# Patient Record
Sex: Female | Born: 1985 | Race: White | Hispanic: No | Marital: Married | Smoking: Former smoker
Health system: Southern US, Community
[De-identification: ages and names within clinical notes are randomized; demographics above are authoritative.]

## PROBLEM LIST (undated history)

## (undated) ENCOUNTER — Inpatient Hospital Stay (HOSPITAL_COMMUNITY): Payer: Self-pay

## (undated) DIAGNOSIS — K649 Unspecified hemorrhoids: Secondary | ICD-10-CM

## (undated) DIAGNOSIS — N739 Female pelvic inflammatory disease, unspecified: Secondary | ICD-10-CM

## (undated) DIAGNOSIS — G43909 Migraine, unspecified, not intractable, without status migrainosus: Secondary | ICD-10-CM

## (undated) DIAGNOSIS — F329 Major depressive disorder, single episode, unspecified: Secondary | ICD-10-CM

## (undated) DIAGNOSIS — O139 Gestational [pregnancy-induced] hypertension without significant proteinuria, unspecified trimester: Secondary | ICD-10-CM

## (undated) DIAGNOSIS — F32A Depression, unspecified: Secondary | ICD-10-CM

## (undated) HISTORY — DX: Migraine, unspecified, not intractable, without status migrainosus: G43.909

## (undated) HISTORY — PX: TONSILECTOMY, ADENOIDECTOMY, BILATERAL MYRINGOTOMY AND TUBES: SHX2538

## (undated) HISTORY — DX: Female pelvic inflammatory disease, unspecified: N73.9

## (undated) HISTORY — PX: TONSILLECTOMY: SUR1361

---

## 2012-01-05 ENCOUNTER — Encounter (HOSPITAL_COMMUNITY): Payer: Self-pay | Admitting: *Deleted

## 2012-01-05 ENCOUNTER — Inpatient Hospital Stay (HOSPITAL_COMMUNITY)
Admission: AD | Admit: 2012-01-05 | Discharge: 2012-01-06 | Disposition: A | Discharge status: Home / Self Care | Payer: 59 | Source: Ambulatory Visit | Attending: Obstetrics & Gynecology | Admitting: Obstetrics & Gynecology

## 2012-01-05 DIAGNOSIS — N898 Other specified noninflammatory disorders of vagina: Secondary | ICD-10-CM

## 2012-01-05 DIAGNOSIS — Z30431 Encounter for routine checking of intrauterine contraceptive device: Secondary | ICD-10-CM

## 2012-01-05 DIAGNOSIS — Z01419 Encounter for gynecological examination (general) (routine) without abnormal findings: Secondary | ICD-10-CM

## 2012-01-05 NOTE — Progress Notes (Signed)
PT HAS NOT HAD CYCLE SINCE 12-2007- SINCE MIRENA INSERTED.     WENT TO URGENT CARE YESTERDAY- FOR YEAST INFECTION-  DR Lucianne Muss  COULD NOT FIND STRING.  .  PT HAS NOT FELT STRING.      TOLD TO SEE  A DR.  HER DR IS IN [place].

## 2012-01-06 NOTE — ED Provider Notes (Signed)
History     CSN: [id number]  Arrival date & time 01/05/12  2042   None     No chief complaint on file.  HPI Brooke Hale is a 26 y.o. female who presents to [facility] for concerns about her IUD. She went to [facility] yesterday for a yeast infection and the doctor told her he did not see an IUD string. The IUD was placed by her GYN in [place] 4 years ago and she has never been able to feel the string. She now lives in [place] and has no GYN here.The patient denies pain,bleeding or other problems.  The history was provided by the patient.  No past medical history on file.  No past surgical history on file.  No family history on file.  History  Substance Use Topics  . Smoking status: Not on file  . Smokeless tobacco: Not on file  . Alcohol Use: Not on file    OB History    Grav Para Term Preterm Abortions TAB SAB Ect Mult Living   1         1      Review of Systems: As stated in HPI  Allergies  Review of patient's allergies indicates not on file.  Home Medications  No current outpatient prescriptions on file.  BP 121/85  Pulse 64  Temp(Src) 99.9 F (37.7 C) (Oral)  Resp 20  Ht 5\' 3"  (1.6 m)  Wt 125 lb 4 oz (56.813 kg)  BMI 22.19 kg/m2  Physical Exam  Nursing note and vitals reviewed. Constitutional: She is oriented to person, place, and time. She appears well-developed and well-nourished. No distress.  HENT:  Head: Normocephalic.  Eyes: EOM are normal.  Neck: Neck supple.  Cardiovascular: Normal rate.   Pulmonary/Chest: Effort normal.  Abdominal: Soft. There is no tenderness.  Genitourinary:       External genitalia without lesions. White discharge vaginal vault. Cervix without lesions. White IUD string visible.  Musculoskeletal: Normal range of motion.  Neurological: She is alert and oriented to person, place, and time. No cranial nerve deficit.  Skin: Skin is warm and dry.  Psychiatric: She has a normal mood and affect. Her behavior is normal.  Judgment and thought content normal.   Assessment: IUD string visible  Plan:  Make appointment with GYN of choice for pap and yearly exam   Return here as needed. ED Course  Procedures  MDM          Kerrie Buffalo, NP 01/06/12 [phone]

## 2012-01-06 NOTE — Discharge Instructions (Signed)
FOLLOW UP WITH THE GYN DOCTOR OF CHOICE TO GET YOUR YEARLY CHECK UP. RETURN HERE AS NEEDED.

## 2012-08-03 DIAGNOSIS — N739 Female pelvic inflammatory disease, unspecified: Secondary | ICD-10-CM

## 2012-08-03 HISTORY — DX: Female pelvic inflammatory disease, unspecified: N73.9

## 2013-01-24 ENCOUNTER — Encounter: Payer: Self-pay | Admitting: Obstetrics and Gynecology

## 2013-01-24 ENCOUNTER — Ambulatory Visit (INDEPENDENT_AMBULATORY_CARE_PROVIDER_SITE_OTHER): Payer: 59 | Admitting: Obstetrics and Gynecology

## 2013-01-24 VITALS — BP 110/58 | Ht 63.5 in | Wt 120.5 lb

## 2013-01-24 DIAGNOSIS — Z01419 Encounter for gynecological examination (general) (routine) without abnormal findings: Secondary | ICD-10-CM

## 2013-01-24 DIAGNOSIS — F17200 Nicotine dependence, unspecified, uncomplicated: Secondary | ICD-10-CM

## 2013-01-24 DIAGNOSIS — Z30432 Encounter for removal of intrauterine contraceptive device: Secondary | ICD-10-CM

## 2013-01-24 DIAGNOSIS — Z23 Encounter for immunization: Secondary | ICD-10-CM

## 2013-01-24 MED ORDER — LEVONORGEST-ETH ESTRAD 91-DAY 0.15-0.03 MG PO TABS: 1.0000 | ORAL_TABLET | Freq: Every day | ORAL | Status: DC

## 2013-01-24 NOTE — Progress Notes (Signed)
Patient ID: Brooke Hale, female   DOB: 1986/06/14, 27 y.o.   MRN: [id number]  27 y.o. SingleCaucasian female   G1P0 here for annual exam.   Patient has Mirena IUD, which expired in February 2014. Diagnosed with "pelvic inflammatory disease"  in October 2013.  Patient seen at an urgent care and was diagnosed with yeast vaginitis and "PID."  No cultures were performed to the patient's knowledge.  Patient was treated only with Diglucan and not antibiotics.  Provider was unable to find IUD strings.  Patient was seen at the [facility] and strings were identified by provider there. Considering future childbearing within the next 2 years.  Has used NubaRing and came out with intercourse.  Didn't like Ortho Evra due to the adhesive.  No problems taking OCPs in the past.  Has light menses with Mirena.    Wants to quit smoking before wedding in October 2013.  Patient's last menstrual period was 01/21/2013.          Sexually active: yes  The current method of family planning is none.    Exercising: No. Last mammogram:  Never Last pap smear:  2 years ago at urgent care.   History of abnormal pap:  No. Last tetanus shot: 2004 Last cholesterol check: 2012  Hgb:                Urine:    No health maintenance topics applied.  Family History  Problem Relation Age of Onset  . Asthma Mother   . Breast cancer Maternal Grandmother   . Diabetes Maternal Grandfather   . Hypertension Maternal Grandfather   . Heart failure Maternal Grandfather   . Hyperlipidemia Maternal Grandfather   . Stroke Maternal Grandfather     There is no problem list on file for this patient.   Past Medical History  Diagnosis Date  . PID (pelvic inflammatory disease) 08/2012    tx'd for PID  . Migraine     Past Surgical History  Procedure Laterality Date  . Tonsilectomy, adenoidectomy, bilateral myringotomy and tubes      Allergies: Amoxicillin; Augmentin; Bactrim; Ceclor; Cefixime; Erythromycin;  Levaquin; Neomycin; Penicillins; and Latex  Current Outpatient Prescriptions  Medication Sig Dispense Refill  . levonorgestrel (MIRENA) 20 MCG/24HR IUD 1 each by Intrauterine route once.       No current facility-administered medications for this visit.    ROS: Pertinent items are noted in HPI.  Exam:    BP 110/58  Ht 5' 3.5" (1.613 m)  Wt 120 lb 8 oz (54.658 kg)  BMI 21.01 kg/m2  LMP 01/21/2013   Wt Readings from Last 3 Encounters:  01/24/13 120 lb 8 oz (54.658 kg)  01/05/12 125 lb 4 oz (56.813 kg)     Ht Readings from Last 3 Encounters:  01/24/13 5' 3.5" (1.613 m)  01/05/12 5\' 3"  (1.6 m)    General appearance: alert, cooperative and appears stated age Head: Normocephalic, without obvious abnormality, atraumatic Neck: no adenopathy, supple, symmetrical, trachea midline and thyroid not enlarged, symmetric, no tenderness/mass/nodules Lungs: clear to auscultation bilaterally Breasts: Inspection negative, No nipple retraction or dimpling, No nipple discharge or bleeding, No axillary or supraclavicular adenopathy, Normal to palpation without dominant masses Heart: regular rate and rhythm Abdomen: soft, non-tender; bowel sounds normal; no masses,  no organomegaly Extremities: extremities normal, atraumatic, no cyanosis or edema Skin: Skin color, texture, turgor normal. No rashes or lesions Lymph nodes: Cervical, supraclavicular, and axillary nodes normal. No abnormal inguinal nodes palpated Neurologic:  Grossly normal   Pelvic: External genitalia:  no lesions              Urethra:  normal appearing urethra with no masses, tenderness or lesions              Bartholins and Skenes: normal                 Vagina: normal appearing vagina with normal color and discharge, no lesions              Cervix: normal appearance              Pap taken: yes.         Bimanual Exam:  Uterus:  uterus is normal size, shape, consistency and nontender. Anteverted.                                       Adnexa: normal adnexa in size, nontender and no masses                                      Rectovaginal: Confirms                                      Anus:  normal sphincter tone, no lesions  Procedure:  Mirena IUD removal.   Consent performed for removal.  Use of sterile dressing forceps to grasp string just inside cervical os.  IUD removed without complication.  A: well woman Expired IUD removed. Doubt history of PID. Due for tetanus. Tobacco use.  P: pap smear Start Seasonale.  Risks and benefits reviewed. Tdap vaccine today. Tobacco cessation discussed with patient.  She plans to quit before upcoming wedding. return annually or prn     An After Visit Summary was printed and given to the patient.

## 2013-01-24 NOTE — Patient Instructions (Addendum)
EXERCISE AND DIET:  We recommended that you start or continue a regular exercise program for good health. Regular exercise means any activity that makes your heart beat faster and makes you sweat.  We recommend exercising at least 30 minutes per day at least 3 days a week, preferably 4 or 5.  We also recommend a diet low in fat and sugar.  Inactivity, poor dietary choices and obesity can cause diabetes, heart attack, stroke, and kidney damage, among others.    ALCOHOL AND SMOKING:  Women should limit their alcohol intake to no more than 7 drinks/beers/glasses of wine (combined, not each!) per week. Moderation of alcohol intake to this level decreases your risk of breast cancer and liver damage. And of course, no recreational drugs are part of a healthy lifestyle.  And absolutely no smoking or even second hand smoke. Most people know smoking can cause heart and lung diseases, but did you know it also contributes to weakening of your bones? Aging of your skin?  Yellowing of your teeth and nails?  CALCIUM AND VITAMIN D:  Adequate intake of calcium and Vitamin D are recommended.  The recommendations for exact amounts of these supplements seem to change often, but generally speaking 600 mg of calcium (either carbonate or citrate) and 800 units of Vitamin D per day seems prudent. Certain women may benefit from higher intake of Vitamin D.  If you are among these women, your doctor will have told you during your visit.    PAP SMEARS:  Pap smears, to check for cervical cancer or precancers,  have traditionally been done yearly, although recent scientific advances have shown that most women can have pap smears less often.  However, every woman still should have a physical exam from her gynecologist every year. It will include a breast check, inspection of the vulva and vagina to check for abnormal growths or skin changes, a visual exam of the cervix, and then an exam to evaluate the size and shape of the uterus and  ovaries.  And after 28 years of age, a rectal exam is indicated to check for rectal cancers. We will also provide age appropriate advice regarding health maintenance, like when you should have certain vaccines, screening for sexually transmitted diseases, bone density testing, colonoscopy, mammograms, etc.   MAMMOGRAMS:  All women over 40 years old should have a yearly mammogram. Many facilities now offer a "3D" mammogram, which may cost around $50 extra out of pocket. If possible,  we recommend you accept the option to have the 3D mammogram performed.  It both reduces the number of women who will be called back for extra views which then turn out to be normal, and it is better than the routine mammogram at detecting truly abnormal areas.    COLONOSCOPY:  Colonoscopy to screen for colon cancer is recommended for all women at age 50.  We know, you hate the idea of the prep.  We agree, BUT, having colon cancer and not knowing it is worse!!  Colon cancer so often starts as a polyp that can be seen and removed at colonscopy, which can quite literally save your life!  And if your first colonoscopy is normal and you have no family history of colon cancer, most women don't have to have it again for 10 years.  Once every ten years, you can do something that may end up saving your life, right?  We will be happy to help you get it scheduled when you are ready.    Be sure to check your insurance coverage so you understand how much it will cost.  It may be covered as a preventative service at no cost, but you should check your particular policy.    Smoking Cessation Quitting smoking is important to your health and has many advantages. However, it is not always easy to quit since nicotine is a very addictive drug. Often times, people try 3 times or more before being able to quit. This document explains the best ways for you to prepare to quit smoking. Quitting takes hard work and a lot of effort, but you can do  it. ADVANTAGES OF QUITTING SMOKING  You will live longer, feel better, and live better.  Your body will feel the impact of quitting smoking almost immediately.  Within 20 minutes, blood pressure decreases. Your pulse returns to its normal level.  After 8 hours, carbon monoxide levels in the blood return to normal. Your oxygen level increases.  After 24 hours, the chance of having a heart attack starts to decrease. Your breath, hair, and body stop smelling like smoke.  After 48 hours, damaged nerve endings begin to recover. Your sense of taste and smell improve.  After 72 hours, the body is virtually free of nicotine. Your bronchial tubes relax and breathing becomes easier.  After 2 to 12 weeks, lungs can hold more air. Exercise becomes easier and circulation improves.  The risk of having a heart attack, stroke, cancer, or lung disease is greatly reduced.  After 1 year, the risk of coronary heart disease is cut in half.  After 5 years, the risk of stroke falls to the same as a nonsmoker.  After 10 years, the risk of lung cancer is cut in half and the risk of other cancers decreases significantly.  After 15 years, the risk of coronary heart disease drops, usually to the level of a nonsmoker.  If you are pregnant, quitting smoking will improve your chances of having a healthy baby.  The people you live with, especially any children, will be healthier.  You will have extra money to spend on things other than cigarettes. QUESTIONS TO THINK ABOUT BEFORE ATTEMPTING TO QUIT You may want to talk about your answers with your caregiver.  Why do you want to quit?  If you tried to quit in the past, what helped and what did not?  What will be the most difficult situations for you after you quit? How will you plan to handle them?  Who can help you through the tough times? Your family? Friends? A caregiver?  What pleasures do you get from smoking? What ways can you still get pleasure if  you quit? Here are some questions to ask your caregiver:  How can you help me to be successful at quitting?  What medicine do you think would be best for me and how should I take it?  What should I do if I need more help?  What is smoking withdrawal like? How can I get information on withdrawal? GET READY  Set a quit date.  Change your environment by getting rid of all cigarettes, ashtrays, matches, and lighters in your home, car, or work. Do not let people smoke in your home.  Review your past attempts to quit. Think about what worked and what did not. GET SUPPORT AND ENCOURAGEMENT You have a better chance of being successful if you have help. You can get support in many ways.  Tell your family, friends, and co-workers that you are  going to quit and need their support. Ask them not to smoke around you.  Get individual, group, or telephone counseling and support. Programs are available at [employer] and health centers. Call your local health department for information about programs in your area.  Spiritual beliefs and practices may help some smokers quit.  Download a "quit meter" on your computer to keep track of quit statistics, such as how long you have gone without smoking, cigarettes not smoked, and money saved.  Get a self-help book about quitting smoking and staying off of tobacco. LEARN NEW SKILLS AND BEHAVIORS  Distract yourself from urges to smoke. Talk to someone, go for a walk, or occupy your time with a task.  Change your normal routine. Take a different route to work. Drink tea instead of coffee. Eat breakfast in a different place.  Reduce your stress. Take a hot bath, exercise, or read a book.  Plan something enjoyable to do every day. Reward yourself for not smoking.  Explore interactive web-based programs that specialize in helping you quit. GET MEDICINE AND USE IT CORRECTLY Medicines can help you stop smoking and decrease the urge to smoke. Combining  medicine with the above behavioral methods and support can greatly increase your chances of successfully quitting smoking.  Nicotine replacement therapy helps deliver nicotine to your body without the negative effects and risks of smoking. Nicotine replacement therapy includes nicotine gum, lozenges, inhalers, nasal sprays, and skin patches. Some may be available over-the-counter and others require a prescription.  Antidepressant medicine helps people abstain from smoking, but how this works is unknown. This medicine is available by prescription.  Nicotinic receptor partial agonist medicine simulates the effect of nicotine in your brain. This medicine is available by prescription. Ask your caregiver for advice about which medicines to use and how to use them based on your health history. Your caregiver will tell you what side effects to look out for if you choose to be on a medicine or therapy. Carefully read the information on the package. Do not use any other product containing nicotine while using a nicotine replacement product.  RELAPSE OR DIFFICULT SITUATIONS Most relapses occur within the first 3 months after quitting. Do not be discouraged if you start smoking again. Remember, most people try several times before finally quitting. You may have symptoms of withdrawal because your body is used to nicotine. You may crave cigarettes, be irritable, feel very hungry, cough often, get headaches, or have difficulty concentrating. The withdrawal symptoms are only temporary. They are strongest when you first quit, but they will go away within 10 14 days. To reduce the chances of relapse, try to:  Avoid drinking alcohol. Drinking lowers your chances of successfully quitting.  Reduce the amount of caffeine you consume. Once you quit smoking, the amount of caffeine in your body increases and can give you symptoms, such as a rapid heartbeat, sweating, and anxiety.  Avoid smokers because they can make you  want to smoke.  Do not let weight gain distract you. Many smokers will gain weight when they quit, usually less than 10 pounds. Eat a healthy diet and stay active. You can always lose the weight gained after you quit.  Find ways to improve your mood other than smoking. FOR MORE INFORMATION  www.smokefree.gov  Document Released: 10/14/2001 Document Revised: 04/20/2012 Document Reviewed: 01/29/2012 [facility] Patient Information 2013 [place], [place].

## 2013-01-31 ENCOUNTER — Other Ambulatory Visit: Payer: Self-pay | Admitting: Obstetrics and Gynecology

## 2013-02-02 ENCOUNTER — Other Ambulatory Visit: Payer: Self-pay | Admitting: Obstetrics and Gynecology

## 2013-02-02 DIAGNOSIS — Z01419 Encounter for gynecological examination (general) (routine) without abnormal findings: Secondary | ICD-10-CM

## 2013-02-02 LAB — IPS HPV ON A LIQUID BASED SPECIMEN

## 2013-02-04 ENCOUNTER — Encounter: Payer: Self-pay | Admitting: Obstetrics and Gynecology

## 2013-02-04 ENCOUNTER — Telehealth: Payer: Self-pay | Admitting: Obstetrics and Gynecology

## 2013-02-07 ENCOUNTER — Other Ambulatory Visit: Payer: Self-pay | Admitting: Obstetrics and Gynecology

## 2013-02-09 NOTE — Telephone Encounter (Signed)
Pt notified of normal pap smear results.

## 2013-09-08 ENCOUNTER — Other Ambulatory Visit: Payer: Self-pay

## 2014-01-27 ENCOUNTER — Ambulatory Visit: Payer: 59 | Admitting: Obstetrics and Gynecology

## 2014-01-30 ENCOUNTER — Ambulatory Visit (INDEPENDENT_AMBULATORY_CARE_PROVIDER_SITE_OTHER): Payer: 59 | Admitting: Obstetrics and Gynecology

## 2014-01-30 ENCOUNTER — Encounter: Payer: Self-pay | Admitting: Obstetrics and Gynecology

## 2014-01-30 VITALS — BP 122/72 | HR 70 | Resp 16 | Ht 63.25 in | Wt 125.4 lb

## 2014-01-30 DIAGNOSIS — Z01419 Encounter for gynecological examination (general) (routine) without abnormal findings: Secondary | ICD-10-CM

## 2014-01-30 DIAGNOSIS — N9489 Other specified conditions associated with female genital organs and menstrual cycle: Secondary | ICD-10-CM

## 2014-01-30 DIAGNOSIS — R319 Hematuria, unspecified: Secondary | ICD-10-CM

## 2014-01-30 DIAGNOSIS — Z0000 Encounter for general adult medical examination without abnormal findings: Secondary | ICD-10-CM

## 2014-01-30 LAB — POCT URINALYSIS DIPSTICK
Bilirubin, UA: NEGATIVE
GLUCOSE UA: NEGATIVE
Ketones, UA: NEGATIVE
Leukocytes, UA: NEGATIVE
Nitrite, UA: NEGATIVE
PH UA: 5
PROTEIN UA: NEGATIVE
UROBILINOGEN UA: NEGATIVE

## 2014-01-30 LAB — HEMOGLOBIN, FINGERSTICK: Hemoglobin, fingerstick: 14.8 g/dL (ref 12.0–16.0)

## 2014-01-30 NOTE — Patient Instructions (Signed)

## 2014-01-30 NOTE — Progress Notes (Signed)
Patient ID: Brooke Hale, female   DOB: [birth year], 28 y.o.   MRN: [id number] GYNECOLOGY VISIT  PCP:   None  Referring provider:   HPI: 28 y.o.   Married  Caucasian  female   G1P0 with Patient's last menstrual period was 10/31/2013.   here for  AEX.  On Seasonale.  Less yeast infections that when on Mirena.  Thinking about planning a family.  Son is 62 years old.   Hgb:     Urine:  1+ RBC--asymptomatic.    GYNECOLOGIC HISTORY: Patient's last menstrual period was 10/31/2013. Sexually active:  yes Partner preference: female Contraception: OCP's--Seasonale  Menopausal hormone therapy: n/a DES exposure:  no Blood transfusions:  no  Sexually transmitted diseases:  no  GYN procedures and prior surgeries:  no Last mammogram:  n/a               Last pap and high risk HPV testing:   01-25-13 ASCUS and negative HR HPV. History of abnormal pap smear:  no   OB History   Grav Para Term Preterm Abortions TAB SAB Ect Mult Living   1         1       LIFESTYLE: Exercise:   no            Tobacco:   5 cigarettes a day--trying to quit Alcohol:      no Drug use:   no  OTHER HEALTH MAINTENANCE: Tetanus/TDap:  01-24-13 Gardisil:             no Influenza:           2013 Zostavax:           n/a  Bone density:   n/a Colonoscopy:   n/a  Cholesterol check:    Elevated in 2012  Family History  Problem Relation Age of Onset  . Asthma Mother   . Breast cancer Maternal Grandmother   . Diabetes Maternal Grandfather   . Hypertension Maternal Grandfather   . Heart failure Maternal Grandfather   . Hyperlipidemia Maternal Grandfather   . Stroke Maternal Grandfather     There are no active problems to display for this patient.  Past Medical History  Diagnosis Date  . PID (pelvic inflammatory disease) 08/2012    tx'd for PID  . Migraine     Past Surgical History  Procedure Laterality Date  . Tonsilectomy, adenoidectomy, bilateral myringotomy and tubes      ALLERGIES:  Amoxicillin; Augmentin; Bactrim; Ceclor; Cefixime; Erythromycin; Levaquin; Neomycin; Penicillins; and Latex  Current Outpatient Prescriptions  Medication Sig Dispense Refill  . levonorgestrel-ethinyl estradiol (SEASONALE,INTROVALE,JOLESSA) 0.15-0.03 MG tablet Take 1 tablet by mouth daily.  1 Package  3   No current facility-administered medications for this visit.     ROS:  Pertinent items are noted in HPI.  SOCIAL HISTORY:  Married.  Has 29 year old son.   PHYSICAL EXAMINATION:    BP 122/72  Pulse 70  Resp 16  Ht 5' 3.25" (1.607 m)  Wt 125 lb 6.4 oz (56.881 kg)  BMI 22.03 kg/m2  LMP 10/31/2013   Wt Readings from Last 3 Encounters:  01/30/14 125 lb 6.4 oz (56.881 kg)  01/24/13 120 lb 8 oz (54.658 kg)  01/05/12 125 lb 4 oz (56.813 kg)     Ht Readings from Last 3 Encounters:  01/30/14 5' 3.25" (1.607 m)  01/24/13 5' 3.5" (1.613 m)  01/05/12 5\' 3"  (1.6 m)    General appearance: alert, cooperative and appears  stated age Head: Normocephalic, without obvious abnormality, atraumatic Neck: no adenopathy, supple, symmetrical, trachea midline and thyroid not enlarged, symmetric, no tenderness/mass/nodules Lungs: clear to auscultation bilaterally Breasts: Inspection negative, No nipple retraction or dimpling, No nipple discharge or bleeding, No axillary or supraclavicular adenopathy, Normal to palpation without dominant masses Heart: regular rate and rhythm Abdomen: soft, non-tender; no masses,  no organomegaly Extremities: extremities normal, atraumatic, no cyanosis or edema Skin: Skin color, texture, turgor normal. No rashes or lesions Lymph nodes: Cervical, supraclavicular, and axillary nodes normal. No abnormal inguinal nodes palpated Neurologic: Grossly normal  Pelvic: External genitalia:  no lesions              Urethra:  normal appearing urethra with no masses, tenderness or lesions              Bartholins and Skenes: normal                 Vagina: normal appearing vagina  with normal color and discharge, no lesions              Cervix: normal appearance              Pap and high risk HPV testing done: no.            Bimanual Exam:  Uterus:  uterus is normal size, shape, consistency and nontender                                      Adnexa: right - normal adnexa in size, nontender and no masses.  Left - mass noted 2 cm, irregular and tender - ovary or bowel?                                      Rectovaginal: Confirms                                      Anus:  normal sphincter tone, no lesions  ASSESSMENT  Left adnexal mass. History of ASCUS and negative HR HPV. Considering future pregnancy.  Tobacco use. Microscopic hematuria.   PLAN  Pap smear and high risk HPV testing next year.  Start PNV. Does not desire refills on OCPs. Wait 3 menstrual periods before trying for pregnancy off OCPs.  Pelvic ultrasound.  Recommend tobacco cessation.  Discussed avoidance of ETOH, unnecessary medications, and uncooked, unpasturized foods. Urine micro and culture. Counseled on self breast exam, Calcium and vitamin D intake, exercise.  Return annually or prn   An After Visit Summary was printed and given to the patient.

## 2014-01-31 ENCOUNTER — Telehealth: Payer: Self-pay | Admitting: Obstetrics and Gynecology

## 2014-01-31 LAB — URINALYSIS, MICROSCOPIC ONLY
Bacteria, UA: NONE SEEN
Casts: NONE SEEN
Crystals: NONE SEEN

## 2014-01-31 NOTE — Telephone Encounter (Signed)
Patient returned call. Advised patient of $50 copay for PUS. Scheduled PUS. Advised of cancellation policy and cancellation fee. Patient agreeable.

## 2014-01-31 NOTE — Telephone Encounter (Signed)
Left message to call back. Need to go over benefits and schedule PUS

## 2014-02-01 LAB — URINE CULTURE

## 2014-02-09 ENCOUNTER — Ambulatory Visit (INDEPENDENT_AMBULATORY_CARE_PROVIDER_SITE_OTHER): Payer: 59

## 2014-02-09 ENCOUNTER — Ambulatory Visit (INDEPENDENT_AMBULATORY_CARE_PROVIDER_SITE_OTHER): Payer: 59 | Admitting: Obstetrics and Gynecology

## 2014-02-09 ENCOUNTER — Encounter: Payer: Self-pay | Admitting: Obstetrics and Gynecology

## 2014-02-09 VITALS — BP 118/70 | HR 56 | Ht 63.25 in | Wt 126.0 lb

## 2014-02-09 DIAGNOSIS — D259 Leiomyoma of uterus, unspecified: Secondary | ICD-10-CM

## 2014-02-09 DIAGNOSIS — N9489 Other specified conditions associated with female genital organs and menstrual cycle: Secondary | ICD-10-CM

## 2014-02-09 NOTE — Progress Notes (Signed)
Subjective  Patient here for a pelvic ultrasound for a possible left adnexal mass noted on routine pelvic ultrasound.  Also asking about urine culture result.  Considering future childbearing.  Some dyspareunia the last couple of times had intercourse. Recently stopped OCPs. Started PNV.   Objective  Urine culture - mixed morphotypes.  No predominant organisms.  Ultrasound - uterus with 1.5 cm fundal fibroid.  EMS 1.2 mm.  Normal ovaries with follicle development.  No free fluid.     Assessment  No evidence of ovarian mass. Small fundal fibroid.  I believe this is what I palpated on the recent pelvic exam.  Desire for pregnancy.   Plan  Patient educated about fibroids, etiology, life history, symptoms, and observational management at this time.  Written information shared with patient.  Return for pregnancy confirmation or any other concern.   15 minutes face to face time of which over 50% was spent in counseling.   After visit summary to patient.

## 2014-02-09 NOTE — Patient Instructions (Signed)
Fibroids Fibroids are lumps (tumors) that can occur any place in a woman's body. These lumps are not cancerous. Fibroids vary in size, weight, and where they grow. HOME CARE  Do not take aspirin.  Write down the number of pads or tampons you use during your period. Tell your doctor. This can help determine the best treatment for you. GET HELP RIGHT AWAY IF:  You have pain in your lower belly (abdomen) that is not helped with medicine.  You have cramps that are not helped with medicine.  You have more bleeding between or during your period.  You feel lightheaded or pass out (faint).  Your lower belly pain gets worse. MAKE SURE YOU:  Understand these instructions.  Will watch your condition.  Will get help right away if you are not doing well or get worse. Document Released: 11/22/2010 Document Revised: 01/12/2012 Document Reviewed: 11/22/2010 Exit[employer] Patient Information 2014 [employer].  

## 2014-08-21 LAB — OB RESULTS CONSOLE GC/CHLAMYDIA
Chlamydia: NEGATIVE
Gonorrhea: NEGATIVE

## 2014-09-04 ENCOUNTER — Encounter: Payer: Self-pay | Admitting: Obstetrics and Gynecology

## 2014-09-04 LAB — OB RESULTS CONSOLE HIV ANTIBODY (ROUTINE TESTING): HIV: NONREACTIVE

## 2014-09-04 LAB — OB RESULTS CONSOLE ABO/RH: RH Type: POSITIVE

## 2014-09-04 LAB — OB RESULTS CONSOLE HEPATITIS B SURFACE ANTIGEN: HEP B S AG: NEGATIVE

## 2014-09-04 LAB — OB RESULTS CONSOLE RPR: RPR: NONREACTIVE

## 2014-09-04 LAB — OB RESULTS CONSOLE ANTIBODY SCREEN: ANTIBODY SCREEN: NEGATIVE

## 2014-09-04 LAB — OB RESULTS CONSOLE RUBELLA ANTIBODY, IGM: Rubella: IMMUNE

## 2014-11-03 NOTE — L&D Delivery Note (Signed)
Patient was C/C/+3 and pushed for 15 minutes with epidural.   NSVD  OP female infant, Apgars 9,9, weight P.   Double nuchal cord reduced. The patient had on lacerations. Fundus was firm. EBL was expected amount- 100 cc by my estimate. Placenta was delivered intact. Vagina was clear.  Baby was vigorous and doing skin to skin with mother.  Otniel Hoe A

## 2015-01-08 ENCOUNTER — Encounter (HOSPITAL_COMMUNITY): Payer: Self-pay

## 2015-01-08 ENCOUNTER — Inpatient Hospital Stay (HOSPITAL_COMMUNITY)
Admission: AD | Admit: 2015-01-08 | Discharge: 2015-01-08 | Disposition: A | Discharge status: Home / Self Care | Payer: 59 | Source: Ambulatory Visit | Attending: Obstetrics & Gynecology | Admitting: Obstetrics & Gynecology

## 2015-01-08 DIAGNOSIS — K644 Residual hemorrhoidal skin tags: Secondary | ICD-10-CM | POA: Insufficient documentation

## 2015-01-08 DIAGNOSIS — O2242 Hemorrhoids in pregnancy, second trimester: Secondary | ICD-10-CM | POA: Insufficient documentation

## 2015-01-08 DIAGNOSIS — F17210 Nicotine dependence, cigarettes, uncomplicated: Secondary | ICD-10-CM | POA: Insufficient documentation

## 2015-01-08 DIAGNOSIS — K645 Perianal venous thrombosis: Secondary | ICD-10-CM | POA: Diagnosis not present

## 2015-01-08 DIAGNOSIS — Z3A26 26 weeks gestation of pregnancy: Secondary | ICD-10-CM | POA: Diagnosis not present

## 2015-01-08 HISTORY — DX: Unspecified hemorrhoids: K64.9

## 2015-01-08 NOTE — MAU Provider Note (Signed)
History     CSN: [id number]  Arrival date and time: 01/08/15 2637   None     Chief Complaint  Patient presents with  . Hemorrhoids   HPI  Ms Brooke Hale is a 29 y.o. female G2P0 at [redacted]w[redacted]d who presents with hemorrhoids.  Had hemorrhoids following labor with last pregnancy.  She presents with painful hemorrhoids; states the pain is worse than labor.  She has tried extra strength medicaid wipes, and several prescription strength medications.  She went to her OB dr office this morning and the office sent her here.     OB History    Gravida Para Term Preterm AB TAB SAB Ectopic Multiple Living   2         1      Past Medical History  Diagnosis Date  . PID (pelvic inflammatory disease) 08/2012    tx'd for PID  . Migraine   . Hemorrhoids     Past Surgical History  Procedure Laterality Date  . Tonsilectomy, adenoidectomy, bilateral myringotomy and tubes      Family History  Problem Relation Age of Onset  . Asthma Mother   . Breast cancer Maternal Grandmother   . Diabetes Maternal Grandfather   . Hypertension Maternal Grandfather   . Heart failure Maternal Grandfather   . Hyperlipidemia Maternal Grandfather   . Stroke Maternal Grandfather     History  Substance Use Topics  . Smoking status: Former Smoker    Quit date: 02/09/2014  . Smokeless tobacco: Not on file     Comment: 5 cigarettes/day  . Alcohol Use: No    Allergies:  Allergies  Allergen Reactions  . Amoxicillin Hives  . Augmentin [Amoxicillin-Pot Clavulanate] Hives and Itching  . Bactrim Hives, Itching and Nausea Only  . Ceclor [Cefaclor] Hives  . Cefixime Hives and Nausea And Vomiting  . Erythromycin Hives  . Levaquin [Levofloxacin In D5w]   . Neomycin Swelling  . Penicillins Hives and Itching  . Latex Rash    Prescriptions prior to admission  Medication Sig Dispense Refill Last Dose  . folic acid (FOLVITE) 1 MG tablet Take 1 mg by mouth daily.     . Prenatal Vit-Fe Fumarate-FA  (MULTIVITAMIN-PRENATAL) 27-0.8 MG TABS tablet Take 1 tablet by mouth daily at 12 noon.      No results found for this or any previous visit (from the past 48 hour(s)).  Review of Systems  Constitutional: Negative for fever and chills.  Genitourinary:       +hemorrhoids    Physical Exam   Blood pressure 116/85, pulse 66, temperature 98.9 F (37.2 C), temperature source Oral, resp. rate 16, height 5\' 3"  (1.6 m), weight 64.638 kg (142 lb 8 oz), last menstrual period 02/02/2013.  Physical Exam  Constitutional: She is oriented to person, place, and time. She appears well-developed and well-nourished.  Non-toxic appearance. She does not have a sickly appearance. She does not appear ill. She appears distressed.  Patient is tearful and appears uncomfortable   HENT:  Head: Normocephalic.  Eyes: Pupils are equal, round, and reactive to light.  Neck: Neck supple.  Genitourinary: Rectal exam shows external hemorrhoid (4 external hemorrhoids; one appears thrombosed ) and tenderness.  Musculoskeletal: Normal range of motion.  Neurological: She is alert and oriented to person, place, and time.  Skin: Skin is warm. She is not diaphoretic.  Psychiatric: Her behavior is normal.    MAU Course  Procedures  None  MDM Spoke to Dr. Alwyn Pea at  1017 and she recommends the patient come back to the office for incision.  Patient declines pain medication at this time.   Assessment and Plan   A:  1. External thrombosed hemorrhoids    P:  Discharge home in stable condition Patient to go to Dr. Tyler Aas office following DC here    Lezlie Lye, NP 01/08/2015 3:48 PM

## 2015-01-08 NOTE — MAU Note (Signed)
Pt states here for hemorrhoids, states there are four and they are blue. Pt tearful in triage, sitting on pillow. Got hemorrhoids with last delivery. Denies abnormal vaginal discharge or bleeding.

## 2015-03-15 LAB — OB RESULTS CONSOLE GBS: GBS: NEGATIVE

## 2015-04-12 ENCOUNTER — Inpatient Hospital Stay (HOSPITAL_COMMUNITY): Payer: 59 | Admitting: Anesthesiology

## 2015-04-12 ENCOUNTER — Inpatient Hospital Stay (HOSPITAL_COMMUNITY)
Admission: AD | Admit: 2015-04-12 | Discharge: 2015-04-14 | DRG: 775 | Disposition: A | Discharge status: Home / Self Care | Payer: 59 | Source: Ambulatory Visit | Attending: Obstetrics and Gynecology | Admitting: Obstetrics and Gynecology

## 2015-04-12 ENCOUNTER — Encounter (HOSPITAL_COMMUNITY): Payer: Self-pay | Admitting: *Deleted

## 2015-04-12 DIAGNOSIS — Z833 Family history of diabetes mellitus: Secondary | ICD-10-CM

## 2015-04-12 DIAGNOSIS — Z8249 Family history of ischemic heart disease and other diseases of the circulatory system: Secondary | ICD-10-CM | POA: Diagnosis not present

## 2015-04-12 DIAGNOSIS — Z3A39 39 weeks gestation of pregnancy: Secondary | ICD-10-CM | POA: Diagnosis present

## 2015-04-12 DIAGNOSIS — Z87891 Personal history of nicotine dependence: Secondary | ICD-10-CM | POA: Diagnosis not present

## 2015-04-12 DIAGNOSIS — Z823 Family history of stroke: Secondary | ICD-10-CM

## 2015-04-12 DIAGNOSIS — O9989 Other specified diseases and conditions complicating pregnancy, childbirth and the puerperium: Secondary | ICD-10-CM | POA: Diagnosis present

## 2015-04-12 LAB — CBC
HCT: 34.3 % — ABNORMAL LOW (ref 36.0–46.0)
Hemoglobin: 12.2 g/dL (ref 12.0–15.0)
MCH: 31.5 pg (ref 26.0–34.0)
MCHC: 35.6 g/dL (ref 30.0–36.0)
MCV: 88.6 fL (ref 78.0–100.0)
PLATELETS: 133 10*3/uL — AB (ref 150–400)
RBC: 3.87 MIL/uL (ref 3.87–5.11)
RDW: 13.1 % (ref 11.5–15.5)
WBC: 8 10*3/uL (ref 4.0–10.5)

## 2015-04-12 LAB — COMPREHENSIVE METABOLIC PANEL
ALT: 6 U/L — ABNORMAL LOW (ref 14–54)
AST: 17 U/L (ref 15–41)
Albumin: 3.1 g/dL — ABNORMAL LOW (ref 3.5–5.0)
Alkaline Phosphatase: 170 U/L — ABNORMAL HIGH (ref 38–126)
Anion gap: 8 (ref 5–15)
BILIRUBIN TOTAL: 0.6 mg/dL (ref 0.3–1.2)
BUN: 11 mg/dL (ref 6–20)
CALCIUM: 9.1 mg/dL (ref 8.9–10.3)
CHLORIDE: 106 mmol/L (ref 101–111)
CO2: 22 mmol/L (ref 22–32)
Creatinine, Ser: 0.65 mg/dL (ref 0.44–1.00)
GFR calc Af Amer: >60 mL/min (ref >60)
GLUCOSE: 74 mg/dL (ref 65–99)
Potassium: 4 mmol/L (ref 3.5–5.1)
SODIUM: 136 mmol/L (ref 135–145)
Total Protein: 6.1 g/dL — ABNORMAL LOW (ref 6.5–8.1)

## 2015-04-12 LAB — TYPE AND SCREEN
ABO/RH(D): O POS
Antibody Screen: NEGATIVE

## 2015-04-12 LAB — AMNISURE RUPTURE OF MEMBRANE (ROM) NOT AT ARMC: Amnisure ROM: POSITIVE

## 2015-04-12 LAB — ABO/RH: ABO/RH(D): O POS

## 2015-04-12 LAB — RPR: RPR: NONREACTIVE

## 2015-04-12 LAB — LACTATE DEHYDROGENASE: LDH: 145 U/L (ref 98–192)

## 2015-04-12 MED ORDER — PHENYLEPHRINE 40 MCG/ML (10ML) SYRINGE FOR IV PUSH (FOR BLOOD PRESSURE SUPPORT)
80.0000 ug | PREFILLED_SYRINGE | INTRAVENOUS | Status: DC | PRN
  Filled 2015-04-12: qty 2
  Filled 2015-04-12: qty 20

## 2015-04-12 MED ORDER — LIDOCAINE HCL (PF) 1 % IJ SOLN
30.0000 mL | INTRAMUSCULAR | Status: DC | PRN
Start: 2015-04-12 — End: 2015-04-12
  Administered 2015-04-12: 30 mL via SUBCUTANEOUS
  Filled 2015-04-12: qty 30

## 2015-04-12 MED ORDER — HYDROCORTISONE ACE-PRAMOXINE 1-1 % RE FOAM
1.0000 | Freq: Two times a day (BID) | RECTAL | Status: DC
  Administered 2015-04-12 – 2015-04-13 (×2): 1 via RECTAL
  Filled 2015-04-12 (×2): qty 10

## 2015-04-12 MED ORDER — ACETAMINOPHEN 325 MG PO TABS: 650.0000 mg | ORAL_TABLET | ORAL | Status: DC | PRN

## 2015-04-12 MED ORDER — ZOLPIDEM TARTRATE 5 MG PO TABS: 5.0000 mg | ORAL_TABLET | Freq: Every evening | ORAL | Status: DC | PRN

## 2015-04-12 MED ORDER — SIMETHICONE 80 MG PO CHEW: 80.0000 mg | CHEWABLE_TABLET | ORAL | Status: DC | PRN

## 2015-04-12 MED ORDER — FENTANYL 2.5 MCG/ML BUPIVACAINE 1/10 % EPIDURAL INFUSION (WH - ANES): 14.0000 mL/h | INTRAMUSCULAR | Status: DC | PRN

## 2015-04-12 MED ORDER — WITCH HAZEL-GLYCERIN EX PADS: 1.0000 "application " | MEDICATED_PAD | CUTANEOUS | Status: DC | PRN

## 2015-04-12 MED ORDER — METHYLERGONOVINE MALEATE 0.2 MG PO TABS: 0.2000 mg | ORAL_TABLET | ORAL | Status: DC | PRN

## 2015-04-12 MED ORDER — OXYTOCIN 40 UNITS IN LACTATED RINGERS INFUSION - SIMPLE MED: 62.5000 mL/h | INTRAVENOUS | Status: DC

## 2015-04-12 MED ORDER — TERBUTALINE SULFATE 1 MG/ML IJ SOLN
0.2500 mg | Freq: Once | INTRAMUSCULAR | Status: DC | PRN
  Filled 2015-04-12: qty 1

## 2015-04-12 MED ORDER — FENTANYL 2.5 MCG/ML BUPIVACAINE 1/10 % EPIDURAL INFUSION (WH - ANES)
14.0000 mL/h | INTRAMUSCULAR | Status: DC | PRN
  Administered 2015-04-12: 14 mL/h via EPIDURAL
  Filled 2015-04-12: qty 125

## 2015-04-12 MED ORDER — OXYCODONE-ACETAMINOPHEN 5-325 MG PO TABS: 2.0000 | ORAL_TABLET | ORAL | Status: DC | PRN

## 2015-04-12 MED ORDER — IBUPROFEN 800 MG PO TABS
800.0000 mg | ORAL_TABLET | Freq: Three times a day (TID) | ORAL | Status: DC
  Administered 2015-04-12 – 2015-04-14 (×5): 800 mg via ORAL
  Filled 2015-04-12 (×5): qty 1

## 2015-04-12 MED ORDER — OXYTOCIN 40 UNITS IN LACTATED RINGERS INFUSION - SIMPLE MED
1.0000 m[IU]/min | INTRAVENOUS | Status: DC
  Administered 2015-04-12: 2 m[IU]/min via INTRAVENOUS
  Filled 2015-04-12: qty 1000

## 2015-04-12 MED ORDER — MEASLES, MUMPS & RUBELLA VAC ~~LOC~~ INJ
0.5000 mL | INJECTION | Freq: Once | SUBCUTANEOUS | Status: DC
  Filled 2015-04-12: qty 0.5

## 2015-04-12 MED ORDER — LIDOCAINE HCL (PF) 1 % IJ SOLN
INTRAMUSCULAR | Status: DC | PRN
  Administered 2015-04-12: 6 mL
  Administered 2015-04-12: 4 mL

## 2015-04-12 MED ORDER — CITRIC ACID-SODIUM CITRATE 334-500 MG/5ML PO SOLN: 30.0000 mL | ORAL | Status: DC | PRN

## 2015-04-12 MED ORDER — DIPHENHYDRAMINE HCL 25 MG PO CAPS: 25.0000 mg | ORAL_CAPSULE | Freq: Four times a day (QID) | ORAL | Status: DC | PRN

## 2015-04-12 MED ORDER — ONDANSETRON HCL 4 MG PO TABS: 4.0000 mg | ORAL_TABLET | ORAL | Status: DC | PRN

## 2015-04-12 MED ORDER — PRENATAL MULTIVITAMIN CH: 1.0000 | ORAL_TABLET | Freq: Every day | ORAL | Status: DC

## 2015-04-12 MED ORDER — SENNOSIDES-DOCUSATE SODIUM 8.6-50 MG PO TABS
2.0000 | ORAL_TABLET | ORAL | Status: DC
  Administered 2015-04-12 – 2015-04-14 (×2): 2 via ORAL
  Filled 2015-04-12 (×2): qty 2

## 2015-04-12 MED ORDER — BENZOCAINE-MENTHOL 20-0.5 % EX AERO
1.0000 "application " | INHALATION_SPRAY | CUTANEOUS | Status: DC | PRN
  Administered 2015-04-12: 1 via TOPICAL
  Filled 2015-04-12: qty 56

## 2015-04-12 MED ORDER — OXYCODONE-ACETAMINOPHEN 5-325 MG PO TABS
1.0000 | ORAL_TABLET | ORAL | Status: DC | PRN
  Administered 2015-04-14: 1 via ORAL
  Filled 2015-04-12: qty 1

## 2015-04-12 MED ORDER — DIPHENHYDRAMINE HCL 50 MG/ML IJ SOLN: 12.5000 mg | INTRAMUSCULAR | Status: DC | PRN

## 2015-04-12 MED ORDER — DIBUCAINE 1 % RE OINT: 1.0000 "application " | TOPICAL_OINTMENT | RECTAL | Status: DC | PRN

## 2015-04-12 MED ORDER — BUTORPHANOL TARTRATE 1 MG/ML IJ SOLN: 1.0000 mg | INTRAMUSCULAR | Status: DC | PRN

## 2015-04-12 MED ORDER — FLEET ENEMA 7-19 GM/118ML RE ENEM: 1.0000 | ENEMA | RECTAL | Status: DC | PRN

## 2015-04-12 MED ORDER — METHYLERGONOVINE MALEATE 0.2 MG/ML IJ SOLN: 0.2000 mg | INTRAMUSCULAR | Status: DC | PRN

## 2015-04-12 MED ORDER — EPHEDRINE 5 MG/ML INJ
10.0000 mg | INTRAVENOUS | Status: DC | PRN
  Filled 2015-04-12: qty 2

## 2015-04-12 MED ORDER — LANOLIN HYDROUS EX OINT: TOPICAL_OINTMENT | CUTANEOUS | Status: DC | PRN

## 2015-04-12 MED ORDER — SODIUM CHLORIDE 0.9 % IJ SOLN: 3.0000 mL | Freq: Two times a day (BID) | INTRAMUSCULAR | Status: DC

## 2015-04-12 MED ORDER — OXYTOCIN BOLUS FROM INFUSION: 500.0000 mL | INTRAVENOUS | Status: DC

## 2015-04-12 MED ORDER — SODIUM CHLORIDE 0.9 % IV SOLN: 250.0000 mL | INTRAVENOUS | Status: DC | PRN

## 2015-04-12 MED ORDER — TETANUS-DIPHTH-ACELL PERTUSSIS 5-2.5-18.5 LF-MCG/0.5 IM SUSP: 0.5000 mL | Freq: Once | INTRAMUSCULAR | Status: DC

## 2015-04-12 MED ORDER — LACTATED RINGERS IV SOLN: 500.0000 mL | INTRAVENOUS | Status: DC | PRN

## 2015-04-12 MED ORDER — OXYCODONE-ACETAMINOPHEN 5-325 MG PO TABS: 1.0000 | ORAL_TABLET | ORAL | Status: DC | PRN

## 2015-04-12 MED ORDER — MAGNESIUM HYDROXIDE 400 MG/5ML PO SUSP: 30.0000 mL | ORAL | Status: DC | PRN

## 2015-04-12 MED ORDER — LACTATED RINGERS IV SOLN
INTRAVENOUS | Status: DC
Start: 2015-04-12 — End: 2015-04-12

## 2015-04-12 MED ORDER — HYDROCORTISONE 2.5 % RE CREA: 1.0000 "application " | TOPICAL_CREAM | Freq: Two times a day (BID) | RECTAL | Status: DC

## 2015-04-12 MED ORDER — SODIUM CHLORIDE 0.9 % IJ SOLN: 3.0000 mL | INTRAMUSCULAR | Status: DC | PRN

## 2015-04-12 MED ORDER — ONDANSETRON HCL 4 MG/2ML IJ SOLN: 4.0000 mg | INTRAMUSCULAR | Status: DC | PRN

## 2015-04-12 MED ORDER — PREPARATION H 50 % EX PADS: 1.0000 | MEDICATED_PAD | Freq: Every day | CUTANEOUS | Status: DC | PRN

## 2015-04-12 MED ORDER — ONDANSETRON HCL 4 MG/2ML IJ SOLN: 4.0000 mg | Freq: Four times a day (QID) | INTRAMUSCULAR | Status: DC | PRN

## 2015-04-12 MED ORDER — FERROUS SULFATE 325 (65 FE) MG PO TABS
325.0000 mg | ORAL_TABLET | Freq: Two times a day (BID) | ORAL | Status: DC
  Administered 2015-04-12 – 2015-04-14 (×4): 325 mg via ORAL
  Filled 2015-04-12 (×4): qty 1

## 2015-04-12 NOTE — Lactation Note (Signed)
This note was copied from the chart of [place]. Lactation Consultation Note Initial visit at 9 hours of age.  Baby has breastfed a few times.  Mom is holding baby and patting back for spitting and gagging.  Baby is not eager to go to breast at this time.  [facility] LC resources given and discussed.  Encouraged to feed with early cues on demand.  Early newborn behavior discussed.  Hand expression demonstrated with colostrum visible encouraged mom to continue practicing technique.  Mom to call for assist as needed.    Patient Name: Boy Arlenis Blaydes TWSFK'C Date: 04/12/2015 Reason for consult: Initial assessment   Maternal Data Has patient been taught Hand Expression?: Yes Does the patient have breastfeeding experience prior to this delivery?: No  Feeding Feeding Type: Breast Fed  LATCH Score/Interventions                      Lactation Tools Discussed/Used     Consult Status Consult Status: Follow-up Date: 04/13/15 Follow-up type: In-patient    Elsie Sakuma, Justine Null 04/12/2015, 10:30 PM

## 2015-04-12 NOTE — Anesthesia Procedure Notes (Signed)

## 2015-04-12 NOTE — H&P (Signed)
Brooke Hale is a 29 y.o. female presenting for leaking fluid  29 Yo G2P1001 @ 39+4 presents for leaking fluid and was confirmed spontaneously ruptured. Her pregnancy has been uncomplicated.  History OB History    Gravida Para Term Preterm AB TAB SAB Ectopic Multiple Living   2         1     Past Medical History  Diagnosis Date  . PID (pelvic inflammatory disease) 08/2012    tx'd for PID  . Migraine   . Hemorrhoids    Past Surgical History  Procedure Laterality Date  . Tonsilectomy, adenoidectomy, bilateral myringotomy and tubes     Family History: family history includes Asthma in her mother; Breast cancer in her maternal grandmother; Diabetes in her maternal grandfather; Heart failure in her maternal grandfather; Hyperlipidemia in her maternal grandfather; Hypertension in her maternal grandfather; Stroke in her maternal grandfather. Social History:  reports that she quit smoking about 14 months ago. She does not have any smokeless tobacco history on file. She reports that she does not drink alcohol or use illicit drugs.   Prenatal Transfer Tool  Maternal Diabetes: No Genetic Screening: Normal Maternal Ultrasounds/Referrals: Normal Fetal Ultrasounds or other Referrals:  None Maternal Substance Abuse:  No Significant Maternal Medications:  None Significant Maternal Lab Results:  None Other Comments:  None  ROS  Dilation: 1.5 Effacement (%): 70 Station: -2, -3 Exam by:: [employer] RN, Kimberly Whitehurst-Boyd RN Blood pressure 128/86, pulse 64, temperature 98.2 F (36.8 C), temperature source Oral, resp. rate 16, last menstrual period 02/02/2013, SpO2 98 %. Exam Physical Exam  Prenatal labs: ABO, Rh:  O+ Antibody:  Neg Rubella:  Imm RPR:   NR HBsAg:   Neg HIV:   NR GBS:    neg  Assessment/Plan: 1) Admit 2) Epidural on request   Brooke Hale H. 04/12/2015, 5:45 AM

## 2015-04-12 NOTE — MAU Note (Signed)
?   Leaking fluid since 2300, regular contracions.

## 2015-04-12 NOTE — Anesthesia Preprocedure Evaluation (Signed)

## 2015-04-13 LAB — CBC
HCT: 30.3 % — ABNORMAL LOW (ref 36.0–46.0)
Hemoglobin: 10.2 g/dL — ABNORMAL LOW (ref 12.0–15.0)
MCH: 30 pg (ref 26.0–34.0)
MCHC: 33.7 g/dL (ref 30.0–36.0)
MCV: 89.1 fL (ref 78.0–100.0)
PLATELETS: 124 10*3/uL — AB (ref 150–400)
RBC: 3.4 MIL/uL — ABNORMAL LOW (ref 3.87–5.11)
RDW: 13.3 % (ref 11.5–15.5)
WBC: 8.9 10*3/uL (ref 4.0–10.5)

## 2015-04-13 NOTE — Progress Notes (Signed)
Patient is doing well.  She is ambulating, voiding, tolerating PO.  Pain control is good.  Lochia is appropriate.  Baby to NICU this AM for persistent bloody/coffee ground emesis  Filed Vitals:   04/12/15 1606 04/12/15 1940 04/13/15 0355 04/13/15 0540  BP:  140/76 140/76 122/73  Pulse:  79 55 58  Temp: 99.8 F (37.7 C) 98.3 F (36.8 C) 98 F (36.7 C) 98.3 F (36.8 C)  TempSrc: Oral Oral  Oral  Resp:  20 20 18   Height:      Weight:      SpO2:        NAD Fundus firm Ext:  No edema  Lab Results  Component Value Date   WBC 8.9 04/13/2015   HGB 10.2* 04/13/2015   HCT 30.3* 04/13/2015   MCV 89.1 04/13/2015   PLT 124* 04/13/2015    --/--/O POS (06/09 0700)/RImmune  A/P 29 y.o. [id number] PPD#1 s/p TSVD. Routine care.   Expect d/c tomorrow.  Desires circumcisison. Discussed r/b/a of the procedure. Reviewed that circumcision is an elective surgical procedure and not considered medically necessary. Reviewed the risks of the procedure including the risk of infection, bleeding, damage to surrounding structures, including scrotum, shaft, urethra and head of penis, and an undesired cosmetic effect requiring additional procedures for revision. Consent signed.   Will plan circumcision when directed by [place]

## 2015-04-13 NOTE — Anesthesia Postprocedure Evaluation (Signed)
Anesthesia Post Note  Patient: Brooke Hale  Procedure(s) Performed: * No procedures listed *  Anesthesia type: Epidural  Patient location: Mother/Baby  Post pain: Pain level controlled  Post assessment: Post-op Vital signs reviewed  Last Vitals:  Filed Vitals:   04/13/15 0540  BP: 122/73  Pulse: 58  Temp: 36.8 C  Resp: 18    Post vital signs: Reviewed  Level of consciousness:alert  Complications: No apparent anesthesia complications

## 2015-04-13 NOTE — Clinical Social Work Maternal (Signed)
  CLINICAL SOCIAL WORK MATERNAL/CHILD NOTE  Patient Details  Name: Brooke Hale MRN: [id number] Date of Birth: 1986/07/11  Date:  04/13/2015  Clinical Social Worker Initiating Note:  Jaidan Prevette E. Brigitte Pulse, [place] Date/ Time Initiated:  04/13/15/1500     Child's Name:  Brooke Hale   Legal Guardian:   (Parents: Brooke Hale and Brooke Hale)   Need for Interpreter:  None   Date of Referral:        Reason for Referral:   (No referral-NICU admission)   Referral Source:      Address:  [address] [place], [place] [address]  Phone number:  [phone]   Household Members:  Minor Children   Natural Supports (not living in the home):  Extended Family, Immediate Family   Professional Supports:     Employment:     Type of Work:  (MOB works at [employer] and will have approximately 10 weeks off for Maternity leave. FOB is a Clinical biochemist and plans to take time off when baby comes home.)   Education:      Pensions consultant:  Multimedia programmer   Other Resources:      Cultural/Religious Considerations Which May Impact Care:  None stated   Strengths:  Ability to meet basic needs , Compliance with medical plan , Home prepared for child , Understanding of illness, Pediatrician chosen  (Pediatric follow up will take place at [facility] in [place])   Risk Factors/Current Problems:  None   Cognitive State:  Alert , Insightful , Linear Thinking    Mood/Affect:  Comfortable , Calm , Happy    CSW Assessment: CSW met with parents in MOB's first floor room/122 to introduce myself, offer support and complete assessment due to baby's admission to NICU.  Parents were quiet, but pleasant and welcoming of CSW's visit.  They report doing well and provided an update on baby's medical situation.  They appear to be calm and coping well.  They report having a great support system and everything they need for baby at home.  MOB states that her mother, who lives in  [place], is here caring for her 30 year old while they are in the hospital.  They report no questions or concerns regarding NICU admission and state they feel well updated and well cared for.  CSW inquired about MOB's postpartum period after her first delivery and she states that she experienced PPD for about 3 months.  She states she took an antidepressant, but cannot recall which medication.  She states she is not concerned about PPD with this postpartum period, as she is in a much different situation.  She thinks her situation/not being married to the FOB of her first child played a major role in her emotional state.  Parents allowed CSW to provide education on signs and symptoms of PPD and MOB states she will talk with her doctor if she has concerns at any time.  CSW explained ongoing support offered by NICU CSW and thanked them for their time.  CSW has no social concerns at this time.  CSW Plan/Description:  Patient/Family Education , Psychosocial Support and Ongoing Assessment of Needs    Kalman Shan 04/13/2015, 4:48 PM

## 2015-04-13 NOTE — Lactation Note (Signed)
This note was copied from the chart of [place]. Lactation Consultation Note Called to NICU to assist in latching baby. Baby wouldn't BF, fussy, rooting, will not latch. Mom's 2nd baby, didn't BF first baby d/t wouldn't latch.  Mom has everted nipples soft compressible breast, hand expression w/noted colostrum. Mom had pumped BM, attempted to give at breast to latch, baby just wanted to suck colostrum from curve tip syring and not on breast. BF in cradle position, attempted football position. Baby appears hungry, but mad, will only suckle on tip of nipple, tongue thrusting nipple out of mouth when obtains deep latch. Used sandwitch hold to get baby to latch, then pushes out of mouth.  Fitted mom w.#16NS to latch baby but wouldn't even try to suck on breast. Mom stated she doesn't think the baby likes the NS and took it off.  RN attempted to give colostrum in bottle, baby wouldn't suckle, just screaming mad. Encouraged mom to do STS and hold baby to calm.  Encouraged mom to hand express or pump colostrum to give to baby or supplement. Mom has good colostrum, I think baby is so hungry he's mad.  Patient Name: Brooke Hale [id number] Date: 04/13/2015 Reason for consult: Follow-up assessment;Difficult latch   Maternal Data    Feeding Feeding Type: Breast Milk Length of feed: 1 min  LATCH Score/Interventions Latch: Too sleepy or reluctant, no latch achieved, no sucking elicited. Intervention(s): Skin to skin;Teach feeding cues Intervention(s): Adjust position;Breast massage;Assist with latch;Breast compression  Audible Swallowing: None Intervention(s): Hand expression;Skin to skin  Type of Nipple: Everted at rest and after stimulation  Comfort (Breast/Nipple): Soft / non-tender     Hold (Positioning): Assistance needed to correctly position infant at breast and maintain latch. Intervention(s): Skin to skin;Position options;Support Pillows;Breastfeeding basics  reviewed  LATCH Score: 5  Lactation Tools Discussed/Used Tools: Pump Breast pump type: Double-Electric Breast Pump Pump Review: Setup, frequency, and cleaning;Milk Storage Initiated by:: RN Date initiated:: 04/13/15   Consult Status Consult Status: Follow-up Date: 04/13/15 Follow-up type: In-patient    Theodoro Kalata 04/13/2015, 5:44 PM

## 2015-04-14 MED ORDER — IBUPROFEN 800 MG PO TABS: 800.0000 mg | ORAL_TABLET | Freq: Three times a day (TID) | ORAL | Status: DC

## 2015-04-14 NOTE — Progress Notes (Signed)
Patient is doing well.  She is ambulating, voiding, tolerating PO.  Pain control is good.  Lochia is appropriate.  Mildly elevated BPs since delivery, but otherwise asymptomatic.  Baby stable in [place]:   04/13/15 0355 04/13/15 0540 04/13/15 1828 04/14/15 0642  BP: 140/76 122/73 140/83 145/87  Pulse: 55 58 60 60  Temp: 98 F (36.7 C) 98.3 F (36.8 C) 98.3 F (36.8 C) 98.2 F (36.8 C)  TempSrc:  Oral Oral Oral  Resp: 20 18 18 18   Height:      Weight:      SpO2:    100%    NAD Fundus firm Ext:  No edema  Lab Results  Component Value Date   WBC 8.9 04/13/2015   HGB 10.2* 04/13/2015   HCT 30.3* 04/13/2015   MCV 89.1 04/13/2015   PLT 124* 04/13/2015    --/--/O POS (06/09 0700)/RImmune  A/P 29 y.o. [id number] PPD#2 s/p TSVD. Routine care.   Meeting all goals.  D/C to home today. Baby stable in NICU.       [place]

## 2015-04-14 NOTE — Discharge Summary (Signed)
Obstetric Discharge Summary Reason for Admission: rupture of membranes Prenatal Procedures: none Intrapartum Procedures: spontaneous vaginal delivery Postpartum Procedures: none Complications-Operative and Postpartum: none HEMOGLOBIN  Date Value Ref Range Status  04/13/2015 10.2* 12.0 - 15.0 g/dL Final   HCT  Date Value Ref Range Status  04/13/2015 30.3* 36.0 - 46.0 % Final    Physical Exam:  General: alert, cooperative and appears stated age Lochia: appropriate Uterine Fundus: firm DVT Evaluation: No evidence of DVT seen on physical exam. Negative Homan's sign.  Discharge Diagnoses: Term Pregnancy-delivered  Discharge Information: Date: 04/14/2015 Activity: pelvic rest Diet: routine Medications: PNV and Ibuprofen Condition: stable Instructions: refer to practice specific booklet Discharge to: home Follow-up Information    Follow up with HORVATH,MICHELLE A, MD In 4 weeks.   Specialty:  Obstetrics and Gynecology   Contact information:   [address] [place] [id number] [phone]       Newborn Data: Live born female  Birth Weight: 7 lb 14.8 oz (3595 g) APGAR: 9, 9  Baby in NICU w continued emesis.  [place] 04/14/2015, 8:53 AM

## 2015-04-14 NOTE — Discharge Instructions (Signed)

## 2015-04-16 ENCOUNTER — Ambulatory Visit: Payer: Self-pay

## 2015-04-16 NOTE — Lactation Note (Signed)
This note was copied from the chart of [place]. Lactation Consultation Note  Follow up visit made.  Mom positioned baby in cross cradle hold with ease.  Baby latched easily and sustained latch.  Mom using good breast and waking techniques to keep baby active.  Audible swallows heard.  Encouraged to allow baby to finish first breast and then offer opposite breast.  Reviewed signs of hunger and contentment.  Will follow up as needed.  Patient Name: Brooke Hale LMRAJ'H Date: 04/16/2015 Reason for consult: Follow-up assessment   Maternal Data    Feeding Feeding Type: Breast Fed  LATCH Score/Interventions Latch: Grasps breast easily, tongue down, lips flanged, rhythmical sucking. Intervention(s): Skin to skin;Teach feeding cues;Waking techniques Intervention(s): Breast compression;Breast massage;Assist with latch;Adjust position  Audible Swallowing: Spontaneous and intermittent  Type of Nipple: Everted at rest and after stimulation  Comfort (Breast/Nipple): Soft / non-tender     Hold (Positioning): No assistance needed to correctly position infant at breast. Intervention(s): Breastfeeding basics reviewed;Support Pillows;Position options;Skin to skin  LATCH Score: 10  Lactation Tools Discussed/Used     Consult Status      Ave Filter 04/16/2015, 3:12 PM

## 2015-04-16 NOTE — Lactation Note (Signed)
This note was copied from the chart of [place]. Lactation Consultation Note  NICU RN called for breastfeeding assist.  Mom has been attempting for over an hour and states baby keeps pushing nipple out of his mouth.  Baby is rooting and showing feeding cues.  Mom positioned baby in cross cradle hold.  Baby opens and can latch with continued breast compression but becomes very sleepy and does not sustain sucking.  Mom seems discouraged.  Reassurance given and discussed using a nipple shield for better oral stimulation.  Mom seems reluctant to use shield.  Explained it is a tool to use temporarily.  Mom willing to try shield.  Baby initially started sucking better but then fell asleep.  I feel baby is too tired at this point.  Mom will pump and we will attempt again later.  Discussed with RN that with 9 % weight loss and baby fairly lethargic at breast I recommended bottle feeding 30-40 ml of expressed breast milk/formula now and attempt breast again with next sign of hunger.  Encouraged to call me for assist.  Patient Name: Brooke Hale [id number] Date: 04/16/2015 Reason for consult: Follow-up assessment   Maternal Data    Feeding Feeding Type: Breast Fed Length of feed: 60 min  LATCH Score/Interventions Latch: Repeated attempts needed to sustain latch, nipple held in mouth throughout feeding, stimulation needed to elicit sucking reflex. Intervention(s): Teach feeding cues;Waking techniques Intervention(s): Breast compression;Breast massage;Assist with latch;Adjust position  Audible Swallowing: None Intervention(s): Hand expression  Type of Nipple: Everted at rest and after stimulation  Comfort (Breast/Nipple): Soft / non-tender     Hold (Positioning): Assistance needed to correctly position infant at breast and maintain latch. Intervention(s): Breastfeeding basics reviewed  LATCH Score: 6  Lactation Tools Discussed/Used     Consult Status Consult Status:  PRN    Ave Filter 04/16/2015, 10:57 AM

## 2015-04-17 ENCOUNTER — Ambulatory Visit: Payer: Self-pay

## 2015-04-17 NOTE — Lactation Note (Signed)
This note was copied from the chart of [place]. Lactation Consultation Note  Patient Name: Brooke Hale UJWJX'B Date: 04/17/2015 Reason for consult: Follow-up assessment;NICU baby NICU baby 27 days old. Baby awake and crying, and mom called for [facility] assistance. Mom attempting to latch baby when [facility] entered room. Assisted mom to apply #20 NS and enc mom to hand express breast milk into shield. Elicited baby to suckle with gloved finger, but baby would not suckle with NS. Discussed with mom that baby probably still tired from circumcision. Enc mom to feed baby EBM with bottle, and then if baby suckling, attempt to put baby to breast with NS. Baby does appear to have tight lingual frenulum. Enc mom to discuss with pediatrician if she continues to have issues with BF--tender/irritated nipples and/or baby not maintaining a deep latch. Discussed with mom that as her milk is coming in, baby's nursing will probably improve. Enc offering bottle afterwards to make sure baby getting enough at breast.   Reiterated OP/BFSG and enc mom to call for assistance.  Maternal Data    Feeding Feeding Type: Breast Fed Length of feed: 15 min  LATCH Score/Interventions Latch: Too sleepy or reluctant, no latch achieved, no sucking elicited. Intervention(s): Skin to skin;Waking techniques Intervention(s): Adjust position;Assist with latch;Breast compression  Audible Swallowing: None Intervention(s): Skin to skin;Hand expression  Type of Nipple: Everted at rest and after stimulation  Comfort (Breast/Nipple): Soft / non-tender (Nipple slightly red.)     Hold (Positioning): Assistance needed to correctly position infant at breast and maintain latch. Intervention(s): Breastfeeding basics reviewed;Support Pillows;Position options  LATCH Score: 5  Lactation Tools Discussed/Used Tools: Nipple Shields Nipple shield size: 20   Consult Status Consult Status: PRN    Inocente Salles 04/17/2015, 1:48 PM

## 2015-04-17 NOTE — Lactation Note (Signed)
This note was copied from the chart of [place]. Lactation Consultation Note  Patient Name: Brooke Hale [id number] Date: 04/17/2015 Reason for consult: Follow-up assessment;NICU baby NICU baby 75 days old. Called to assist mom with latching baby to breast. Mom states that baby is not at breast very long and hasn't had many wet diapers. Mom states that first baby had a tight lingual frenulum and was never able to really breast feed. Attempted to assess baby's mouth, but baby sound asleep. Baby was circumcised less than 2 hours earlier. Discussed with mom why baby sleepy now. Enc mom to call for assistance with latching when baby cueing to nurse. Mom states that baby has not wanted to take a bottle, but accepted bottle at last feeding. Mom also reports that baby does not like NS. Discussed with mom that her left nipple appears red and irritated.   Mom enc to make outpatient appointment, and is aware of BFSG and availability of scales. Discussed progression of mom's milk coming in and how to know if baby getting enough at breast. Enc mom to offer EBM supplementation after nursing until baby gaining and getting enough at breast.   Mom states she will call for assistance with latching.   Maternal Data    Feeding Feeding Type: Breast Fed Length of feed: 15 min  LATCH Score/Interventions                      Lactation Tools Discussed/Used     Consult Status Consult Status: PRN    Inocente Salles 04/17/2015, 12:40 PM

## 2015-07-04 ENCOUNTER — Encounter (HOSPITAL_BASED_OUTPATIENT_CLINIC_OR_DEPARTMENT_OTHER): Payer: Self-pay | Admitting: *Deleted

## 2015-07-11 ENCOUNTER — Other Ambulatory Visit: Payer: Self-pay | Admitting: General Surgery

## 2015-07-12 ENCOUNTER — Ambulatory Visit (HOSPITAL_BASED_OUTPATIENT_CLINIC_OR_DEPARTMENT_OTHER)
Admission: RE | Admit: 2015-07-12 | Discharge: 2015-07-12 | Disposition: A | Discharge status: Home / Self Care | Payer: 59 | Source: Ambulatory Visit | Attending: General Surgery | Admitting: General Surgery

## 2015-07-12 ENCOUNTER — Encounter (HOSPITAL_BASED_OUTPATIENT_CLINIC_OR_DEPARTMENT_OTHER)
Admission: RE | Disposition: A | Discharge status: Home / Self Care | Payer: Self-pay | Source: Ambulatory Visit | Attending: General Surgery

## 2015-07-12 ENCOUNTER — Ambulatory Visit (HOSPITAL_BASED_OUTPATIENT_CLINIC_OR_DEPARTMENT_OTHER): Payer: 59 | Admitting: Anesthesiology

## 2015-07-12 ENCOUNTER — Encounter (HOSPITAL_BASED_OUTPATIENT_CLINIC_OR_DEPARTMENT_OTHER): Payer: Self-pay | Admitting: *Deleted

## 2015-07-12 DIAGNOSIS — K644 Residual hemorrhoidal skin tags: Secondary | ICD-10-CM | POA: Diagnosis present

## 2015-07-12 DIAGNOSIS — G43909 Migraine, unspecified, not intractable, without status migrainosus: Secondary | ICD-10-CM | POA: Insufficient documentation

## 2015-07-12 DIAGNOSIS — Z888 Allergy status to other drugs, medicaments and biological substances status: Secondary | ICD-10-CM | POA: Diagnosis not present

## 2015-07-12 DIAGNOSIS — Z881 Allergy status to other antibiotic agents status: Secondary | ICD-10-CM | POA: Diagnosis not present

## 2015-07-12 DIAGNOSIS — K449 Diaphragmatic hernia without obstruction or gangrene: Secondary | ICD-10-CM | POA: Insufficient documentation

## 2015-07-12 DIAGNOSIS — Z880 Allergy status to penicillin: Secondary | ICD-10-CM | POA: Diagnosis not present

## 2015-07-12 HISTORY — PX: HEMORRHOID SURGERY: SHX153

## 2015-07-12 SURGERY — HEMORRHOIDECTOMY
Anesthesia: General | Site: Rectum

## 2015-07-12 MED ORDER — PROPOFOL 10 MG/ML IV BOLUS
INTRAVENOUS | Status: DC | PRN
  Administered 2015-07-12: 200 mg via INTRAVENOUS

## 2015-07-12 MED ORDER — BUPIVACAINE LIPOSOME 1.3 % IJ SUSP
INTRAMUSCULAR | Status: AC
  Filled 2015-07-12: qty 20

## 2015-07-12 MED ORDER — GLYCOPYRROLATE 0.2 MG/ML IJ SOLN: 0.2000 mg | Freq: Once | INTRAMUSCULAR | Status: DC | PRN

## 2015-07-12 MED ORDER — DIBUCAINE 1 % EX OINT: TOPICAL_OINTMENT | Freq: Three times a day (TID) | CUTANEOUS | Status: DC | PRN

## 2015-07-12 MED ORDER — ONDANSETRON HCL 4 MG/2ML IJ SOLN
INTRAMUSCULAR | Status: DC | PRN
  Administered 2015-07-12: 4 mg via INTRAVENOUS

## 2015-07-12 MED ORDER — CHLORHEXIDINE GLUCONATE 4 % EX LIQD: 1.0000 "application " | Freq: Once | CUTANEOUS | Status: DC

## 2015-07-12 MED ORDER — MIDAZOLAM HCL 2 MG/2ML IJ SOLN
INTRAMUSCULAR | Status: AC
  Filled 2015-07-12: qty 4

## 2015-07-12 MED ORDER — LIDOCAINE HCL (CARDIAC) 20 MG/ML IV SOLN
INTRAVENOUS | Status: DC | PRN
  Administered 2015-07-12: 60 mg via INTRAVENOUS

## 2015-07-12 MED ORDER — OXYCODONE HCL 5 MG PO TABS: 5.0000 mg | ORAL_TABLET | Freq: Once | ORAL | Status: DC | PRN

## 2015-07-12 MED ORDER — LIDOCAINE HCL (CARDIAC) 20 MG/ML IV SOLN
INTRAVENOUS | Status: AC
  Filled 2015-07-12: qty 5

## 2015-07-12 MED ORDER — OXYCODONE-ACETAMINOPHEN 5-325 MG PO TABS: 1.0000 | ORAL_TABLET | ORAL | Status: DC | PRN

## 2015-07-12 MED ORDER — OXYCODONE HCL 5 MG/5ML PO SOLN: 5.0000 mg | Freq: Once | ORAL | Status: DC | PRN

## 2015-07-12 MED ORDER — LACTATED RINGERS IV SOLN
INTRAVENOUS | Status: DC
  Administered 2015-07-12: 10 mL/h via INTRAVENOUS

## 2015-07-12 MED ORDER — ACETAMINOPHEN 325 MG PO TABS: 325.0000 mg | ORAL_TABLET | ORAL | Status: DC | PRN

## 2015-07-12 MED ORDER — HYDROCODONE-ACETAMINOPHEN 5-325 MG PO TABS: 1.0000 | ORAL_TABLET | ORAL | Status: DC | PRN

## 2015-07-12 MED ORDER — FENTANYL CITRATE (PF) 100 MCG/2ML IJ SOLN
50.0000 ug | INTRAMUSCULAR | Status: DC | PRN
  Administered 2015-07-12: 100 ug via INTRAVENOUS

## 2015-07-12 MED ORDER — SCOPOLAMINE 1 MG/3DAYS TD PT72: 1.0000 | MEDICATED_PATCH | Freq: Once | TRANSDERMAL | Status: DC | PRN

## 2015-07-12 MED ORDER — CIPROFLOXACIN IN D5W 400 MG/200ML IV SOLN
400.0000 mg | INTRAVENOUS | Status: AC
  Administered 2015-07-12 (×2): 400 mg via INTRAVENOUS

## 2015-07-12 MED ORDER — CIPROFLOXACIN IN D5W 400 MG/200ML IV SOLN
INTRAVENOUS | Status: AC
  Filled 2015-07-12: qty 200

## 2015-07-12 MED ORDER — BUPIVACAINE LIPOSOME 1.3 % IJ SUSP
INTRAMUSCULAR | Status: DC | PRN
  Administered 2015-07-12 (×2): 10 mL

## 2015-07-12 MED ORDER — DEXAMETHASONE SODIUM PHOSPHATE 10 MG/ML IJ SOLN
INTRAMUSCULAR | Status: AC
  Filled 2015-07-12: qty 1

## 2015-07-12 MED ORDER — MIDAZOLAM HCL 2 MG/2ML IJ SOLN
1.0000 mg | INTRAMUSCULAR | Status: DC | PRN
  Administered 2015-07-12: 2 mg via INTRAVENOUS

## 2015-07-12 MED ORDER — DOCUSATE SODIUM 100 MG PO CAPS: 100.0000 mg | ORAL_CAPSULE | Freq: Two times a day (BID) | ORAL | Status: DC

## 2015-07-12 MED ORDER — DIBUCAINE 1 % RE OINT
TOPICAL_OINTMENT | RECTAL | Status: DC | PRN
  Administered 2015-07-12: 1 via RECTAL

## 2015-07-12 MED ORDER — FENTANYL CITRATE (PF) 100 MCG/2ML IJ SOLN
INTRAMUSCULAR | Status: AC
  Filled 2015-07-12: qty 4

## 2015-07-12 MED ORDER — DEXAMETHASONE SODIUM PHOSPHATE 4 MG/ML IJ SOLN
INTRAMUSCULAR | Status: DC | PRN
  Administered 2015-07-12: 10 mg via INTRAVENOUS

## 2015-07-12 MED ORDER — FENTANYL CITRATE (PF) 100 MCG/2ML IJ SOLN: 25.0000 ug | INTRAMUSCULAR | Status: DC | PRN

## 2015-07-12 MED ORDER — ACETAMINOPHEN 160 MG/5ML PO SOLN: 325.0000 mg | ORAL | Status: DC | PRN

## 2015-07-12 MED ORDER — ONDANSETRON HCL 4 MG/2ML IJ SOLN
INTRAMUSCULAR | Status: AC
  Filled 2015-07-12: qty 2

## 2015-07-12 SURGICAL SUPPLY — 35 items
BLADE SURG 15 STRL LF DISP TIS (BLADE) ×1 IMPLANT
BLADE SURG 15 STRL SS (BLADE) ×2
CANISTER SUCT 1200ML W/VALVE (MISCELLANEOUS) ×3 IMPLANT
COVER MAYO STAND STRL (DRAPES) IMPLANT
DECANTER SPIKE VIAL GLASS SM (MISCELLANEOUS) IMPLANT
DRAPE UTILITY XL STRL (DRAPES) IMPLANT
DRSG PAD ABDOMINAL 8X10 ST (GAUZE/BANDAGES/DRESSINGS) ×3 IMPLANT
ELECT REM PT RETURN 9FT ADLT (ELECTROSURGICAL) ×3
ELECTRODE REM PT RTRN 9FT ADLT (ELECTROSURGICAL) ×1 IMPLANT
GAUZE SPONGE 4X4 12PLY STRL (GAUZE/BANDAGES/DRESSINGS) ×3 IMPLANT
GLOVE BIO SURGEON STRL SZ7.5 (GLOVE) ×3 IMPLANT
GLOVE BIOGEL PI IND STRL 7.5 (GLOVE) ×3 IMPLANT
GLOVE BIOGEL PI INDICATOR 7.5 (GLOVE) ×6
GLOVE SURG SS PI 7.5 STRL IVOR (GLOVE) ×6 IMPLANT
GOWN STRL REUS W/ TWL LRG LVL3 (GOWN DISPOSABLE) ×3 IMPLANT
GOWN STRL REUS W/TWL LRG LVL3 (GOWN DISPOSABLE) ×6
NEEDLE HYPO 25X1 1.5 SAFETY (NEEDLE) ×3 IMPLANT
PACK BASIN DAY SURGERY FS (CUSTOM PROCEDURE TRAY) ×3 IMPLANT
PACK LITHOTOMY IV (CUSTOM PROCEDURE TRAY) ×3 IMPLANT
PENCIL BUTTON HOLSTER BLD 10FT (ELECTRODE) ×3 IMPLANT
SHEET MEDIUM DRAPE 40X70 STRL (DRAPES) ×3 IMPLANT
SLEEVE SCD COMPRESS KNEE MED (MISCELLANEOUS) ×3 IMPLANT
SPONGE GAUZE 4X4 12PLY STER LF (GAUZE/BANDAGES/DRESSINGS) ×3 IMPLANT
SPONGE SURGIFOAM ABS GEL 12-7 (HEMOSTASIS) ×3 IMPLANT
SURGILUBE 2OZ TUBE FLIPTOP (MISCELLANEOUS) ×3 IMPLANT
SUT CHROMIC 2 0 SH (SUTURE) ×3 IMPLANT
SYR CONTROL 10ML LL (SYRINGE) ×3 IMPLANT
TOWEL OR 17X24 6PK STRL BLUE (TOWEL DISPOSABLE) ×3 IMPLANT
TOWEL OR NON WOVEN STRL DISP B (DISPOSABLE) ×3 IMPLANT
TRAY DSU PREP LF (CUSTOM PROCEDURE TRAY) ×3 IMPLANT
TRAY PROCTOSCOPIC FIBER OPTIC (SET/KITS/TRAYS/PACK) IMPLANT
TUBE CONNECTING 20'X1/4 (TUBING) ×1
TUBE CONNECTING 20X1/4 (TUBING) ×2 IMPLANT
UNDERPAD 30X30 (UNDERPADS AND DIAPERS) ×3 IMPLANT
YANKAUER SUCT BULB TIP NO VENT (SUCTIONS) ×3 IMPLANT

## 2015-07-12 NOTE — Discharge Instructions (Signed)
° ° °  Post Anesthesia Home Care Instructions  Activity: Get plenty of rest for the remainder of the day. A responsible adult should stay with you for 24 hours following the procedure.  For the next 24 hours, DO NOT: -Drive a car -Operate machinery -Drink alcoholic beverages -Take any medication unless instructed by your physician -Make any legal decisions or sign important papers.  Meals: Start with liquid foods such as gelatin or soup. Progress to regular foods as tolerated. Avoid greasy, spicy, heavy foods. If nausea and/or vomiting occur, drink only clear liquids until the nausea and/or vomiting subsides. Call your physician if vomiting continues.  Special Instructions/Symptoms: Your throat may feel dry or sore from the anesthesia or the breathing tube placed in your throat during surgery. If this causes discomfort, gargle with warm salt water. The discomfort should disappear within 24 hours.  If you had a scopolamine patch placed behind your ear for the management of post- operative nausea and/or vomiting:  1. The medication in the patch is effective for 72 hours, after which it should be removed.  Wrap patch in a tissue and discard in the trash. Wash hands thoroughly with soap and water. 2. You may remove the patch earlier than 72 hours if you experience unpleasant side effects which may include dry mouth, dizziness or visual disturbances. 3. Avoid touching the patch. Wash your hands with soap and water after contact with the patch.   Information for Discharge Teaching: EXPAREL (bupivacaine liposome injectable suspension)   Your surgeon gave you EXPAREL(bupivacaine) in your surgical incision to help control your pain after surgery.   EXPAREL is a local anesthetic that provides pain relief by numbing the tissue around the surgical site.  EXPAREL is designed to release pain medication over time and can control pain for up to 72 hours.  Depending on how you respond to EXPAREL, you may  require less pain medication during your recovery.  Possible side effects:  Temporary loss of sensation or ability to move in the area where bupivacaine was injected.  Nausea, vomiting, constipation  Rarely, numbness and tingling in your mouth or lips, lightheadedness, or anxiety may occur.  Call your doctor right away if you think you may be experiencing any of these sensations, or if you have other questions regarding possible side effects.  Follow all other discharge instructions given to you by your surgeon or nurse. Eat a healthy diet and drink plenty of water or other fluids.  If you return to the hospital for any reason within 96 hours following the administration of EXPAREL, please inform your health care providers. 

## 2015-07-12 NOTE — Transfer of Care (Signed)
Immediate Anesthesia Transfer of Care Note  Patient: Nancee Liter  Procedure(s) Performed: Procedure(s): EXTERNAL HEMORRHOIDECTOMY (N/A)  Patient Location: PACU  Anesthesia Type:General  Level of Consciousness: awake, alert  and oriented  Airway & Oxygen Therapy: Patient Spontanous Breathing  Post-op Assessment: Report given to RN, Post -op Vital signs reviewed and stable and Patient moving all extremities  Post vital signs: Reviewed and stable  Last Vitals:  Filed Vitals:   07/12/15 0945  BP: 140/104  Pulse: 74  Temp:   Resp: 15    Complications: No apparent anesthesia complications

## 2015-07-12 NOTE — Anesthesia Postprocedure Evaluation (Signed)
  Anesthesia Post-op Note  Patient: Brooke Hale  Procedure(s) Performed: Procedure(s): EXTERNAL HEMORRHOIDECTOMY (N/A)  Patient Location: PACU  Anesthesia Type:General  Level of Consciousness: awake  Airway and Oxygen Therapy: Patient Spontanous Breathing  Post-op Pain: none  Post-op Assessment: Post-op Vital signs reviewed, Patient's Cardiovascular Status Stable, Respiratory Function Stable, Patent Airway, No signs of Nausea or vomiting and Pain level controlled              Post-op Vital Signs: Reviewed and stable  Last Vitals:  Filed Vitals:   07/12/15 1045  BP: 155/96  Pulse: 52  Temp: 36.5 C  Resp: 18    Complications: No apparent anesthesia complications

## 2015-07-12 NOTE — Op Note (Addendum)
07/12/2015  9:31 AM  PATIENT:  Brooke Hale  29 y.o. female  PRE-OPERATIVE DIAGNOSIS:  External Hemorrhoids  POST-OPERATIVE DIAGNOSIS:  External Hemorrhoids  PROCEDURE:  Procedure(s): EXTERNAL HEMORRHOIDECTOMY (N/A)  SURGEON:  Surgeon(s) and Role:    * Jovita Kussmaul, MD - Primary  PHYSICIAN ASSISTANT:   ASSISTANTS: none   ANESTHESIA:   general  EBL:  Total I/O In: 300 [I.V.:300] Out: -   BLOOD ADMINISTERED:none  DRAINS: none   LOCAL MEDICATIONS USED:  OTHER Exparel  SPECIMEN:  Source of Specimen:  left anterior, left lateral, and right posterolateral hemorrhoidal skin tags  DISPOSITION OF SPECIMEN:  PATHOLOGY  COUNTS:  YES  TOURNIQUET:  * No tourniquets in log *  DICTATION: .Dragon Dictation   After informed consent was obtained the patient was brought to the operating room and placed in the supine position on the operating room table. After adequate induction of general anesthesia the patient was placed in lithotomy position and all pressure points are padded. The perirectal area was prepped with Betadine and draped in usual sterile manner. The area around the rectum was infiltrated with Exparel. A boat retractor was then placed inside the rectum and the rectum was examined circumferentially. The patient had several small skin tags externally and no appreciable internal hemorrhoidal tissue. The larger skin tags were in the left anterior, left lateral, and right posterior lateral positions. Each of these external skin tags were excised sharply with Metzenbaum scissors. They were all sent to pathology for further evaluation. The base of each skin tag was then fulgurated with the cautery until it was hemostatic. The right sided skin tag was large enough to close most of the incision with a single 2-0 chromic figure-of-eight stitch. Once this was accomplished all areas appeared hemostatic. A small piece of Gelfoam covered with dibucaine ointment was placed inside the  rectum. The external rectum was coated with a cane ointment and sterile dressings were applied. The patient tolerated the procedure well. At the end of the case all needle sponge and instrument counts were correct. The patient was then awakened and taken to recovery in stable condition.  PLAN OF CARE: Discharge to home after PACU  PATIENT DISPOSITION:  PACU - hemodynamically stable.   Delay start of Pharmacological VTE agent (>24hrs) due to surgical blood loss or risk of bleeding: not applicable

## 2015-07-12 NOTE — Anesthesia Procedure Notes (Signed)
Procedure Name: LMA Insertion Date/Time: 07/12/2015 8:53 AM Performed by: Melynda Ripple D Pre-anesthesia Checklist: Patient identified, Emergency Drugs available, Suction available and Patient being monitored Patient Re-evaluated:Patient Re-evaluated prior to inductionOxygen Delivery Method: Circle System Utilized Preoxygenation: Pre-oxygenation with 100% oxygen Intubation Type: IV induction Ventilation: Mask ventilation without difficulty LMA: LMA inserted LMA Size: 4.0 Grade View: Grade I Number of attempts: 1 Airway Equipment and Method: Bite block Placement Confirmation: positive ETCO2 Tube secured with: Tape Dental Injury: Teeth and Oropharynx as per pre-operative assessment

## 2015-07-12 NOTE — Anesthesia Preprocedure Evaluation (Signed)
Anesthesia Evaluation  Patient identified by MRN, date of birth, ID band Patient awake    Reviewed: Allergy & Precautions, NPO status , Patient's Chart, lab work & pertinent test results  History of Anesthesia Complications Negative for: history of anesthetic complications  Airway Mallampati: I  TM Distance: >3 FB Neck ROM: Full    Dental  (+) Teeth Intact   Pulmonary neg shortness of breath, neg sleep apnea, neg COPD, neg recent URI, former smoker, neg PE   breath sounds clear to auscultation       Cardiovascular negative cardio ROS   Rhythm:Regular     Neuro/Psych  Headaches, neg Seizures negative psych ROS   GI/Hepatic negative GI ROS, Neg liver ROS, hiatal hernia,   Endo/Other  negative endocrine ROS  Renal/GU negative Renal ROS     Musculoskeletal   Abdominal   Peds  Hematology negative hematology ROS (+)   Anesthesia Other Findings   Reproductive/Obstetrics                             Anesthesia Physical Anesthesia Plan  ASA: I  Anesthesia Plan: General   Post-op Pain Management:    Induction: Intravenous  Airway Management Planned: LMA  Additional Equipment: None  Intra-op Plan:   Post-operative Plan: Extubation in OR  Informed Consent: I have reviewed the patients History and Physical, chart, labs and discussed the procedure including the risks, benefits and alternatives for the proposed anesthesia with the patient or authorized representative who has indicated his/her understanding and acceptance.   Dental advisory given  Plan Discussed with: CRNA and Surgeon  Anesthesia Plan Comments:         Anesthesia Quick Evaluation

## 2015-07-12 NOTE — H&P (Signed)
Brooke Hale. Kaser 05/28/2015 2:04 PM Location: [facility] Patient #: [id number] DOB: 08/30/1986 Married / Language: English / Race: White Female  History of Present Illness Sammuel Hines. Marlou Starks MD; 05/28/2015 2:37 PM) Patient words: hems.  The patient is a 29 year old female who presents for a follow-up for Hemorrhoids. The patient is a 29 year old white female who we saw recently when she was 8 months pregnant. At that time she had hemorrhoids that were bothering her. Since her last visit she has delivered her baby who is healthy. Her hemorrhoids have improved but she continues to have external hemorrhoids that cause her irritation and make it difficult for her to get clean. She denies any problems with constipation or diarrhea.   Allergies (Sonya Bynum, CMA; 05/28/2015 2:04 PM) Amoxicillin *PENICILLINS* Augmentin ES-600 *PENICILLINS* Bactrim *ANTI-INFECTIVE AGENTS - MISC.* Ceclor *CEPHALOSPORINS* Cefixime *CEPHALOSPORINS* Erythromycin *DERMATOLOGICALS* Hives. Neomycin Sulfate *Aminoglycosides** Penicillin G Benzathine *PENICILLINS*  Medication History Davy Pique Bynum, CMA; 05/28/2015 2:05 PM) Tylenol Sinus Congestion/Pain (5-325MG  Tablet, Oral) Active. Tums Lasting Effects (500MG  Tablet Chewable, Oral) Active. Hydrocortisone Acetate (25MG  Suppository, Rectal) Active. Hydrocortisone Acetate (2.5% Cream, Rectal) Active. Magnesium Oxide (400MG  Tablet, Oral) Active. Prenatal Plus/Iron (27-1MG  Tablet, Oral) Active. Witch Hazel-Glycerin (50-10% Pad, External) Active. Nitrofurantoin (100MG  Capsule, Oral) Active. Medications Reconciled  Review of Systems Eddie Dibbles S. Marlou Starks MD; 05/28/2015 2:37 PM) General Not Present- Appetite Loss, Chills, Fatigue, Fever, Night Sweats, Weight Gain and Weight Loss. Skin Not Present- Change in Wart/Mole, Dryness, Hives, Jaundice, New Lesions, Non-Healing Wounds, Rash and Ulcer. HEENT Present- Seasonal Allergies and Wears glasses/contact  lenses. Not Present- Earache, Hearing Loss, Hoarseness, Nose Bleed, Oral Ulcers, Ringing in the Ears, Sinus Pain, Sore Throat, Visual Disturbances and Yellow Eyes. Respiratory Not Present- Bloody sputum, Chronic Cough, Difficulty Breathing, Snoring and Wheezing. Breast Not Present- Breast Mass, Breast Pain, Nipple Discharge and Skin Changes. Cardiovascular Not Present- Chest Pain, Difficulty Breathing Lying Down, Leg Cramps, Palpitations, Rapid Heart Rate, Shortness of Breath and Swelling of Extremities. Gastrointestinal Present- Hemorrhoids and Rectal Pain. Not Present- Abdominal Pain, Bloating, Bloody Stool, Change in Bowel Habits, Chronic diarrhea, Constipation, Difficulty Swallowing, Excessive gas, Gets full quickly at meals, Indigestion, Nausea and Vomiting. Female Genitourinary Not Present- Frequency, Nocturia, Painful Urination, Pelvic Pain and Urgency. Musculoskeletal Not Present- Back Pain, Joint Pain, Joint Stiffness, Muscle Pain, Muscle Weakness and Swelling of Extremities. Neurological Not Present- Decreased Memory, Fainting, Headaches, Numbness, Seizures, Tingling, Tremor, Trouble walking and Weakness. Psychiatric Not Present- Anxiety, Bipolar, Change in Sleep Pattern, Depression, Fearful and Frequent crying. Endocrine Not Present- Cold Intolerance, Excessive Hunger, Hair Changes, Heat Intolerance, Hot flashes and New Diabetes. Hematology Not Present- Easy Bruising, Excessive bleeding, Gland problems, HIV and Persistent Infections.   Vitals (Sonya Bynum CMA; 05/28/2015 2:04 PM) 05/28/2015 2:04 PM Weight: 132 lb Height: 63in Body Surface Area: 1.63 m Body Mass Index: 23.38 kg/m Temp.: 97.71F(Temporal)  Pulse: 72 (Regular)  BP: 114/68 (Sitting, Left Arm, Standard)    Physical Exam Eddie Dibbles S. Marlou Starks MD; 05/28/2015 2:38 PM) General Mental Status-Alert. General Appearance-Consistent with stated age. Hydration-Well hydrated. Voice-Normal.  Head and  Neck Head-normocephalic, atraumatic with no lesions or palpable masses. Trachea-midline. Thyroid Gland Characteristics - normal size and consistency.  Eye Eyeball - Bilateral-Extraocular movements intact. Sclera/Conjunctiva - Bilateral-No scleral icterus.  Chest and Lung Exam Chest and lung exam reveals -quiet, even and easy respiratory effort with no use of accessory muscles and on auscultation, normal breath sounds, no adventitious sounds and normal vocal resonance. Inspection Chest Wall - Normal. Back - normal.  Cardiovascular Cardiovascular examination reveals -normal heart sounds, regular rate and rhythm with no murmurs and normal pedal pulses bilaterally.  Abdomen Inspection Inspection of the abdomen reveals - No Hernias. Skin - Scar - no surgical scars. Palpation/Percussion Palpation and Percussion of the abdomen reveal - Soft, Non Tender, No Rebound tenderness, No Rigidity (guarding) and No hepatosplenomegaly. Auscultation Auscultation of the abdomen reveals - Bowel sounds normal.  Rectal Note: The perirectal skin looks good. There are 3 columns of external hemorrhoidal skin tags. They are small to moderate size. There is no acute inflammation or infection. On digital exam there is no palpable mass and good rectal tone. On anoscopic exam there appears to be minimal internal hemorrhoidal tissue   Neurologic Neurologic evaluation reveals -alert and oriented x 3 with no impairment of recent or remote memory. Mental Status-Normal.  Musculoskeletal Normal Exam - Left-Upper Extremity Strength Normal and Lower Extremity Strength Normal. Normal Exam - Right-Upper Extremity Strength Normal and Lower Extremity Strength Normal.  Lymphatic Head & Neck  General Head & Neck Lymphatics: Bilateral - Description - Normal. Axillary  General Axillary Region: Bilateral - Description - Normal. Tenderness - Non Tender. Femoral & Inguinal  Generalized Femoral &  Inguinal Lymphatics: Bilateral - Description - Normal. Tenderness - Non Tender.    Assessment & Plan Eddie Dibbles S. Marlou Starks MD; 05/28/2015 2:34 PM) EXTERNAL HEMORRHOIDS (455.3  K64.4) Impression: The patient continues to have some small external hemorrhoidal skin tags that are causing her significant discomfort and an inability to get clean. Because of the she would like to have them removed. I have discussed with her in detail the risks and benefits of the operation to remove the skin tags as well as some of the technical aspects and she understands and wishes to proceed     Signed by Luella Cook, MD (05/28/2015 2:39 PM)

## 2015-07-12 NOTE — Interval H&P Note (Signed)
History and Physical Interval Note:  07/12/2015 8:20 AM  Brooke Hale  has presented today for surgery, with the diagnosis of External Hemorrhoids  The various methods of treatment have been discussed with the patient and family. After consideration of risks, benefits and other options for treatment, the patient has consented to  Procedure(s): EXTERNAL HEMORRHOIDECTOMY (N/A) as a surgical intervention .  The patient's history has been reviewed, patient examined, no change in status, stable for surgery.  I have reviewed the patient's chart and labs.  Questions were answered to the patient's satisfaction.     TOTH III,[name]

## 2015-07-13 ENCOUNTER — Encounter (HOSPITAL_BASED_OUTPATIENT_CLINIC_OR_DEPARTMENT_OTHER): Payer: Self-pay | Admitting: General Surgery

## 2016-09-24 ENCOUNTER — Other Ambulatory Visit: Payer: Self-pay | Admitting: Obstetrics & Gynecology

## 2016-09-26 LAB — CYTOLOGY - PAP

## 2016-11-03 NOTE — L&D Delivery Note (Signed)
Patient was C/C/+1 and pushed for 3 minutes with epidural.    NSVD  female infant, Apgars 9,9, weight P.   The patient had no lacerations. Fundus was firm. EBL was expected amount. Placenta was delivered intact. Vagina was clear.  Delayed cord clamping done for 30-60 minutes while warming baby. Baby was vigorous and doing skin to skin with mother.  Brooke Hale A

## 2017-03-24 ENCOUNTER — Other Ambulatory Visit: Payer: Self-pay | Admitting: Obstetrics & Gynecology

## 2017-03-24 LAB — OB RESULTS CONSOLE ANTIBODY SCREEN: ANTIBODY SCREEN: NEGATIVE

## 2017-03-24 LAB — OB RESULTS CONSOLE ABO/RH: RH TYPE: POSITIVE

## 2017-03-24 LAB — OB RESULTS CONSOLE RPR: RPR: NONREACTIVE

## 2017-03-24 LAB — OB RESULTS CONSOLE GC/CHLAMYDIA
Chlamydia: NEGATIVE
GC PROBE AMP, GENITAL: NEGATIVE

## 2017-03-24 LAB — OB RESULTS CONSOLE HEPATITIS B SURFACE ANTIGEN: Hepatitis B Surface Ag: NEGATIVE

## 2017-04-30 ENCOUNTER — Telehealth: Payer: Self-pay | Admitting: General Practice

## 2017-04-30 NOTE — Telephone Encounter (Signed)
Pt was referred to [place] from Jules Husbands, MD from [facility], [place] and is needing to establish care with a PCP. She is [redacted] weeks pregnant and is needing an appointment due to SOB with hx adolescent asthma. Gareth Eagle has referral paperwork with office notes)

## 2017-06-17 ENCOUNTER — Encounter (HOSPITAL_COMMUNITY): Payer: Self-pay

## 2017-06-17 ENCOUNTER — Other Ambulatory Visit (HOSPITAL_COMMUNITY): Payer: Self-pay | Admitting: Obstetrics & Gynecology

## 2017-06-17 DIAGNOSIS — Z3A20 20 weeks gestation of pregnancy: Secondary | ICD-10-CM

## 2017-06-17 DIAGNOSIS — O283 Abnormal ultrasonic finding on antenatal screening of mother: Secondary | ICD-10-CM

## 2017-06-17 DIAGNOSIS — Z3689 Encounter for other specified antenatal screening: Secondary | ICD-10-CM

## 2017-06-19 ENCOUNTER — Ambulatory Visit (HOSPITAL_COMMUNITY)
Admission: RE | Admit: 2017-06-19 | Discharge: 2017-06-19 | Disposition: A | Discharge status: Home / Self Care | Payer: 59 | Source: Ambulatory Visit | Attending: Obstetrics & Gynecology | Admitting: Obstetrics & Gynecology

## 2017-06-19 ENCOUNTER — Encounter (HOSPITAL_COMMUNITY): Payer: Self-pay

## 2017-06-19 DIAGNOSIS — Z3689 Encounter for other specified antenatal screening: Secondary | ICD-10-CM | POA: Diagnosis not present

## 2017-06-19 DIAGNOSIS — Z3A20 20 weeks gestation of pregnancy: Secondary | ICD-10-CM | POA: Diagnosis not present

## 2017-06-19 DIAGNOSIS — O283 Abnormal ultrasonic finding on antenatal screening of mother: Secondary | ICD-10-CM | POA: Diagnosis present

## 2017-06-19 NOTE — Addendum Note (Signed)
Encounter addended by: Berlinda Last, RTR on: 06/19/2017 11:33 AM<BR>    Actions taken: Imaging Exam ended

## 2017-06-22 ENCOUNTER — Other Ambulatory Visit (HOSPITAL_COMMUNITY): Payer: Self-pay | Admitting: *Deleted

## 2017-06-22 DIAGNOSIS — O43199 Other malformation of placenta, unspecified trimester: Secondary | ICD-10-CM

## 2017-07-29 ENCOUNTER — Encounter (HOSPITAL_COMMUNITY): Payer: Self-pay

## 2017-07-31 ENCOUNTER — Encounter (HOSPITAL_COMMUNITY): Payer: Self-pay

## 2017-07-31 ENCOUNTER — Ambulatory Visit (HOSPITAL_COMMUNITY)
Admission: RE | Admit: 2017-07-31 | Discharge: 2017-07-31 | Disposition: A | Discharge status: Home / Self Care | Payer: 59 | Source: Ambulatory Visit | Attending: Obstetrics & Gynecology | Admitting: Obstetrics & Gynecology

## 2017-07-31 DIAGNOSIS — Z362 Encounter for other antenatal screening follow-up: Secondary | ICD-10-CM | POA: Insufficient documentation

## 2017-07-31 DIAGNOSIS — O43192 Other malformation of placenta, second trimester: Secondary | ICD-10-CM | POA: Diagnosis present

## 2017-07-31 DIAGNOSIS — Z3A26 26 weeks gestation of pregnancy: Secondary | ICD-10-CM | POA: Diagnosis not present

## 2017-07-31 DIAGNOSIS — O43199 Other malformation of placenta, unspecified trimester: Secondary | ICD-10-CM

## 2017-10-12 ENCOUNTER — Other Ambulatory Visit: Payer: Self-pay

## 2017-10-12 ENCOUNTER — Inpatient Hospital Stay (HOSPITAL_COMMUNITY)
Admission: AD | Admit: 2017-10-12 | Discharge: 2017-10-12 | Disposition: A | Discharge status: Home / Self Care | Payer: 59 | Source: Ambulatory Visit | Attending: Obstetrics and Gynecology | Admitting: Obstetrics and Gynecology

## 2017-10-12 ENCOUNTER — Encounter (HOSPITAL_COMMUNITY): Payer: Self-pay

## 2017-10-12 DIAGNOSIS — O26893 Other specified pregnancy related conditions, third trimester: Secondary | ICD-10-CM | POA: Insufficient documentation

## 2017-10-12 DIAGNOSIS — O479 False labor, unspecified: Secondary | ICD-10-CM

## 2017-10-12 DIAGNOSIS — Z3A37 37 weeks gestation of pregnancy: Secondary | ICD-10-CM | POA: Diagnosis not present

## 2017-10-12 LAB — URINALYSIS, ROUTINE W REFLEX MICROSCOPIC
BILIRUBIN URINE: NEGATIVE
GLUCOSE, UA: NEGATIVE mg/dL
HGB URINE DIPSTICK: NEGATIVE
Ketones, ur: 20 mg/dL — AB
LEUKOCYTES UA: NEGATIVE
NITRITE: NEGATIVE
PH: 6 (ref 5.0–8.0)
Protein, ur: 30 mg/dL — AB
SPECIFIC GRAVITY, URINE: 1.018 (ref 1.005–1.030)

## 2017-10-12 NOTE — Progress Notes (Addendum)
G3P2 @ 37.[redacted] wksga. Presents to triage for abdominal pain. Denies LOF or bleeding. +FM  EFM applied  SVE: 1.5/40/-3/balottable  2026: Provider notified. Report status of pt given. Orders received to walk pt for couple hrs and return to room for cervical recheck.   2027: Pt sent walking. Instructed pt to return at 2230. Pt verbalizes understanding.   2230: SVE unchanged. Provider notified. Made aware of cervical recheck. Order received to d/c pt home.   D/C instructions given with pt understanding. Pt left home with SO

## 2017-10-12 NOTE — MAU Note (Signed)
Pt reports contractions every 5 mins for past 2 hours. Pt denies LOF, vaginal bleeding. Reports good fetal movement. Cervix was closed last week.

## 2017-10-14 ENCOUNTER — Inpatient Hospital Stay (HOSPITAL_COMMUNITY)
Admission: AD | Admit: 2017-10-14 | Discharge: 2017-10-14 | Disposition: A | Discharge status: Home / Self Care | Payer: 59 | Source: Ambulatory Visit | Attending: Obstetrics and Gynecology | Admitting: Obstetrics and Gynecology

## 2017-10-14 ENCOUNTER — Encounter (HOSPITAL_COMMUNITY): Payer: Self-pay

## 2017-10-14 DIAGNOSIS — R1084 Generalized abdominal pain: Secondary | ICD-10-CM

## 2017-10-14 DIAGNOSIS — Z3A38 38 weeks gestation of pregnancy: Secondary | ICD-10-CM | POA: Insufficient documentation

## 2017-10-14 DIAGNOSIS — O471 False labor at or after 37 completed weeks of gestation: Secondary | ICD-10-CM | POA: Diagnosis present

## 2017-10-14 NOTE — MAU Note (Signed)
Pt here with c/o contractions since about 0030, 3-4 min apart. Now feel like they are spacing out, every 10 min. Reports good fetal movement. Denies any bleeding or leaking of fluid.

## 2017-10-14 NOTE — MAU Note (Signed)
I have communicated with Dr. Ouida Sills and reviewed vital signs:  Vitals:   10/14/17 0232 10/14/17 0503  BP: 139/84 138/80  Pulse: 67 69  Resp: 18 16  Temp: 98.2 F (36.8 C)   SpO2: 99%     Vaginal exam:  Dilation: 1.5 Effacement (%): 50 Cervical Position: Posterior Station: Ballotable Presentation: Vertex Exam by:: Dyann Ruddle, RN,   Also reviewed contraction pattern and that non-stress test is reactive.  It has been documented that patient is contracting irregularly with no cervical change over 1 hours not indicating active labor.  Patient denies any other complaints.  Based on this report provider has given order for discharge.  A discharge order and diagnosis entered by a provider.   Labor discharge instructions reviewed with patient.

## 2017-10-14 NOTE — Discharge Instructions (Signed)

## 2017-10-20 ENCOUNTER — Inpatient Hospital Stay (HOSPITAL_COMMUNITY)
Admission: AD | Admit: 2017-10-20 | Discharge: 2017-10-20 | Disposition: A | Discharge status: Home / Self Care | Payer: 59 | Source: Ambulatory Visit | Attending: Obstetrics and Gynecology | Admitting: Obstetrics and Gynecology

## 2017-10-20 ENCOUNTER — Encounter (HOSPITAL_COMMUNITY): Payer: Self-pay | Admitting: *Deleted

## 2017-10-20 DIAGNOSIS — O479 False labor, unspecified: Secondary | ICD-10-CM

## 2017-10-20 DIAGNOSIS — Z3A39 39 weeks gestation of pregnancy: Secondary | ICD-10-CM | POA: Diagnosis not present

## 2017-10-20 DIAGNOSIS — Z3483 Encounter for supervision of other normal pregnancy, third trimester: Secondary | ICD-10-CM | POA: Insufficient documentation

## 2017-10-20 NOTE — MAU Note (Signed)
Pt reports u/c's Q 5-6 min since 2300. Vomited 5X this evening. Denies any vag bleeding or leaking. +FM.

## 2017-10-20 NOTE — Discharge Instructions (Signed)

## 2017-10-25 ENCOUNTER — Inpatient Hospital Stay (HOSPITAL_COMMUNITY)
Admission: AD | Admit: 2017-10-25 | Discharge: 2017-10-27 | DRG: 807 | Disposition: A | Discharge status: Home / Self Care | Payer: 59 | Source: Ambulatory Visit | Attending: Obstetrics and Gynecology | Admitting: Obstetrics and Gynecology

## 2017-10-25 ENCOUNTER — Inpatient Hospital Stay (HOSPITAL_COMMUNITY): Payer: 59 | Admitting: Anesthesiology

## 2017-10-25 ENCOUNTER — Encounter (HOSPITAL_COMMUNITY): Payer: Self-pay | Admitting: *Deleted

## 2017-10-25 DIAGNOSIS — Z87891 Personal history of nicotine dependence: Secondary | ICD-10-CM | POA: Diagnosis not present

## 2017-10-25 DIAGNOSIS — O134 Gestational [pregnancy-induced] hypertension without significant proteinuria, complicating childbirth: Secondary | ICD-10-CM | POA: Diagnosis present

## 2017-10-25 DIAGNOSIS — O43123 Velamentous insertion of umbilical cord, third trimester: Secondary | ICD-10-CM | POA: Diagnosis present

## 2017-10-25 DIAGNOSIS — Z3A39 39 weeks gestation of pregnancy: Secondary | ICD-10-CM | POA: Diagnosis not present

## 2017-10-25 DIAGNOSIS — O9902 Anemia complicating childbirth: Secondary | ICD-10-CM | POA: Diagnosis present

## 2017-10-25 DIAGNOSIS — D649 Anemia, unspecified: Secondary | ICD-10-CM | POA: Diagnosis present

## 2017-10-25 DIAGNOSIS — R030 Elevated blood-pressure reading, without diagnosis of hypertension: Secondary | ICD-10-CM | POA: Diagnosis present

## 2017-10-25 HISTORY — DX: Gestational (pregnancy-induced) hypertension without significant proteinuria, unspecified trimester: O13.9

## 2017-10-25 LAB — CBC
HCT: 33.5 % — ABNORMAL LOW (ref 36.0–46.0)
HEMOGLOBIN: 11.1 g/dL — AB (ref 12.0–15.0)
MCH: 28.2 pg (ref 26.0–34.0)
MCHC: 33.1 g/dL (ref 30.0–36.0)
MCV: 85 fL (ref 78.0–100.0)
PLATELETS: 164 10*3/uL (ref 150–400)
RBC: 3.94 MIL/uL (ref 3.87–5.11)
RDW: 13.7 % (ref 11.5–15.5)
WBC: 9.3 10*3/uL (ref 4.0–10.5)

## 2017-10-25 LAB — COMPREHENSIVE METABOLIC PANEL
ALBUMIN: 2.9 g/dL — AB (ref 3.5–5.0)
ALT: 7 U/L — ABNORMAL LOW (ref 14–54)
ANION GAP: 8 (ref 5–15)
AST: 19 U/L (ref 15–41)
Alkaline Phosphatase: 190 U/L — ABNORMAL HIGH (ref 38–126)
BILIRUBIN TOTAL: 0.6 mg/dL (ref 0.3–1.2)
BUN: 12 mg/dL (ref 6–20)
CO2: 20 mmol/L — ABNORMAL LOW (ref 22–32)
Calcium: 8.3 mg/dL — ABNORMAL LOW (ref 8.9–10.3)
Chloride: 107 mmol/L (ref 101–111)
Creatinine, Ser: 0.73 mg/dL (ref 0.44–1.00)
GFR calc Af Amer: >60 mL/min (ref >60)
GLUCOSE: 80 mg/dL (ref 65–99)
POTASSIUM: 4.1 mmol/L (ref 3.5–5.1)
Sodium: 135 mmol/L (ref 135–145)
TOTAL PROTEIN: 6.3 g/dL — AB (ref 6.5–8.1)

## 2017-10-25 LAB — PROTEIN / CREATININE RATIO, URINE
CREATININE, URINE: 34 mg/dL
PROTEIN CREATININE RATIO: 0.21 mg/mg{creat} — AB (ref 0.00–0.15)
Total Protein, Urine: 7 mg/dL

## 2017-10-25 LAB — URINALYSIS, ROUTINE W REFLEX MICROSCOPIC
BILIRUBIN URINE: NEGATIVE
Glucose, UA: NEGATIVE mg/dL
Hgb urine dipstick: NEGATIVE
KETONES UR: NEGATIVE mg/dL
Leukocytes, UA: NEGATIVE
NITRITE: NEGATIVE
PH: 6 (ref 5.0–8.0)
PROTEIN: NEGATIVE mg/dL
Specific Gravity, Urine: 1.008 (ref 1.005–1.030)

## 2017-10-25 LAB — TYPE AND SCREEN
ABO/RH(D): O POS
ANTIBODY SCREEN: NEGATIVE

## 2017-10-25 MED ORDER — LACTATED RINGERS IV SOLN
INTRAVENOUS | Status: DC
  Administered 2017-10-25 – 2017-10-26 (×3): via INTRAVENOUS

## 2017-10-25 MED ORDER — OXYCODONE-ACETAMINOPHEN 5-325 MG PO TABS: 1.0000 | ORAL_TABLET | ORAL | Status: DC | PRN

## 2017-10-25 MED ORDER — FLEET ENEMA 7-19 GM/118ML RE ENEM: 1.0000 | ENEMA | RECTAL | Status: DC | PRN

## 2017-10-25 MED ORDER — OXYTOCIN 40 UNITS IN LACTATED RINGERS INFUSION - SIMPLE MED
2.5000 [IU]/h | INTRAVENOUS | Status: DC
  Administered 2017-10-26: 2.5 [IU]/h via INTRAVENOUS

## 2017-10-25 MED ORDER — LACTATED RINGERS IV SOLN
500.0000 mL | INTRAVENOUS | Status: DC | PRN
Start: 2017-10-25 — End: 2017-10-26
  Administered 2017-10-26: 1000 mL via INTRAVENOUS

## 2017-10-25 MED ORDER — EPHEDRINE 5 MG/ML INJ
10.0000 mg | INTRAVENOUS | Status: DC | PRN
Start: 2017-10-25 — End: 2017-10-26
  Filled 2017-10-25: qty 2

## 2017-10-25 MED ORDER — EPHEDRINE 5 MG/ML INJ
10.0000 mg | INTRAVENOUS | Status: DC | PRN
  Filled 2017-10-25: qty 2

## 2017-10-25 MED ORDER — PHENYLEPHRINE 40 MCG/ML (10ML) SYRINGE FOR IV PUSH (FOR BLOOD PRESSURE SUPPORT)
80.0000 ug | PREFILLED_SYRINGE | INTRAVENOUS | Status: DC | PRN
  Filled 2017-10-25: qty 5

## 2017-10-25 MED ORDER — LIDOCAINE HCL (PF) 1 % IJ SOLN
30.0000 mL | INTRAMUSCULAR | Status: DC | PRN
  Filled 2017-10-25: qty 30

## 2017-10-25 MED ORDER — OXYTOCIN BOLUS FROM INFUSION
500.0000 mL | Freq: Once | INTRAVENOUS | Status: AC
  Administered 2017-10-26: 500 mL via INTRAVENOUS

## 2017-10-25 MED ORDER — LIDOCAINE HCL (PF) 1 % IJ SOLN
INTRAMUSCULAR | Status: DC | PRN
  Administered 2017-10-25: 3 mL via EPIDURAL
  Administered 2017-10-25: 4 mL via EPIDURAL

## 2017-10-25 MED ORDER — PHENYLEPHRINE 40 MCG/ML (10ML) SYRINGE FOR IV PUSH (FOR BLOOD PRESSURE SUPPORT)
80.0000 ug | PREFILLED_SYRINGE | INTRAVENOUS | Status: DC | PRN
  Filled 2017-10-25: qty 10
  Filled 2017-10-25: qty 5

## 2017-10-25 MED ORDER — FENTANYL 2.5 MCG/ML BUPIVACAINE 1/10 % EPIDURAL INFUSION (WH - ANES)
14.0000 mL/h | INTRAMUSCULAR | Status: DC | PRN
  Administered 2017-10-25 – 2017-10-26 (×2): 14 mL/h via EPIDURAL
  Filled 2017-10-25 (×2): qty 100

## 2017-10-25 MED ORDER — LACTATED RINGERS IV SOLN
500.0000 mL | Freq: Once | INTRAVENOUS | Status: AC
  Administered 2017-10-26: 250 mL via INTRAVENOUS

## 2017-10-25 MED ORDER — SOD CITRATE-CITRIC ACID 500-334 MG/5ML PO SOLN: 30.0000 mL | ORAL | Status: DC | PRN

## 2017-10-25 MED ORDER — ONDANSETRON HCL 4 MG/2ML IJ SOLN: 4.0000 mg | Freq: Four times a day (QID) | INTRAMUSCULAR | Status: DC | PRN

## 2017-10-25 MED ORDER — OXYTOCIN 40 UNITS IN LACTATED RINGERS INFUSION - SIMPLE MED
1.0000 m[IU]/min | INTRAVENOUS | Status: DC
  Administered 2017-10-25: 2 m[IU]/min via INTRAVENOUS
  Filled 2017-10-25: qty 1000

## 2017-10-25 MED ORDER — OXYCODONE-ACETAMINOPHEN 5-325 MG PO TABS: 2.0000 | ORAL_TABLET | ORAL | Status: DC | PRN

## 2017-10-25 MED ORDER — TERBUTALINE SULFATE 1 MG/ML IJ SOLN
0.2500 mg | Freq: Once | INTRAMUSCULAR | Status: DC | PRN
  Filled 2017-10-25: qty 1

## 2017-10-25 MED ORDER — LACTATED RINGERS IV SOLN
500.0000 mL | Freq: Once | INTRAVENOUS | Status: AC
  Administered 2017-10-25: 500 mL via INTRAVENOUS

## 2017-10-25 MED ORDER — ACETAMINOPHEN 325 MG PO TABS: 650.0000 mg | ORAL_TABLET | ORAL | Status: DC | PRN

## 2017-10-25 MED ORDER — DIPHENHYDRAMINE HCL 50 MG/ML IJ SOLN
12.5000 mg | INTRAMUSCULAR | Status: AC | PRN
  Administered 2017-10-25 – 2017-10-26 (×3): 12.5 mg via INTRAVENOUS
  Filled 2017-10-25: qty 1

## 2017-10-25 NOTE — Anesthesia Procedure Notes (Signed)
Epidural Patient location during procedure: OB Start time: 10/25/2017 10:58 PM  Staffing Anesthesiologist: Josephine Igo, MD Performed: anesthesiologist   Preanesthetic Checklist Completed: patient identified, site marked, surgical consent, pre-op evaluation, timeout performed, IV checked, risks and benefits discussed and monitors and equipment checked  Epidural Patient position: sitting Prep: site prepped and draped and DuraPrep Patient monitoring: continuous pulse ox and blood pressure Approach: midline Location: L3-L4 Injection technique: LOR air  Needle:  Needle type: Tuohy  Needle gauge: 17 G Needle length: 9 cm and 9 Needle insertion depth: 5 cm cm Catheter type: closed end flexible Catheter size: 19 Gauge Catheter at skin depth: 10 cm Test dose: negative and Other  Assessment Events: blood not aspirated, injection not painful, no injection resistance, negative IV test and no paresthesia  Additional Notes Patient identified. Risks and benefits discussed including failed block, incomplete  Pain control, post dural puncture headache, nerve damage, paralysis, blood pressure Changes, nausea, vomiting, reactions to medications-both toxic and allergic and post Partum back pain. All questions were answered. Patient expressed understanding and wished to proceed. Sterile technique was used throughout procedure. Epidural site was Dressed with sterile barrier dressing. No paresthesias, signs of intravascular injection Or signs of intrathecal spread were encountered.  Patient was more comfortable after the epidural was dosed. Please see RN's note for documentation of vital signs and FHR which are stable.

## 2017-10-25 NOTE — Anesthesia Pain Management Evaluation Note (Signed)
  CRNA Pain Management Visit Note  Patient: Brooke Hale, 31 y.o., female  "Hello I am a member of the anesthesia team at [facility]. We have an anesthesia team available at all times to provide care throughout the hospital, including epidural management and anesthesia for C-section. I don't know your plan for the delivery whether it a natural birth, water birth, IV sedation, nitrous supplementation, doula or epidural, but we want to meet your pain goals."   1.Was your pain managed to your expectations on prior hospitalizations?   Yes   2.What is your expectation for pain management during this hospitalization?     Epidural and IV pain meds  3.How can we help you reach that goal? Be available  Record the patient's initial score and the patient's pain goal.   Pain: 4  Pain Goal: 5 [facility] wants you to be able to say your pain was always managed very well.  [facility] 10/25/2017

## 2017-10-25 NOTE — H&P (Signed)
31 y.o. [redacted]w[redacted]d  G3P2002 comes in c/o labor and has elevated blood pressures.  Otherwise has good fetal movement and no bleeding.  Past Medical History:  Diagnosis Date  . Hemorrhoids   . Migraine   . PID (pelvic inflammatory disease) 08/2012   tx'd for PID  . Pregnancy induced hypertension     Past Surgical History:  Procedure Laterality Date  . HEMORRHOID SURGERY N/A 07/12/2015   Procedure: EXTERNAL HEMORRHOIDECTOMY;  Surgeon: Autumn Messing III, MD;  Location: [facility];  Service: General;  Laterality: N/A;  . TONSILECTOMY, ADENOIDECTOMY, BILATERAL MYRINGOTOMY AND TUBES      OB History  Gravida Para Term Preterm AB Living  3 2 2     2   SAB TAB Ectopic Multiple Live Births        0 2    # Outcome Date GA Lbr Len/2nd Weight Sex Delivery Anes PTL Lv  3 Current           2 Term 04/12/15 [redacted]w[redacted]d 11:20 7 lb 14.8 oz (3.595 kg) M Vag-Spont EPI  LIV  1 Term      Vag-Spont EPI N LIV      Social History   Socioeconomic History  . Marital status: Married    Spouse name: Not on file  . Number of children: Not on file  . Years of education: Not on file  . Highest education level: Not on file  Social Needs  . Financial resource strain: Not on file  . Food insecurity - worry: Not on file  . Food insecurity - inability: Not on file  . Transportation needs - medical: Not on file  . Transportation needs - non-medical: Not on file  Occupational History  . Not on file  Tobacco Use  . Smoking status: Former Smoker    Last attempt to quit: 02/09/2014    Years since quitting: 3.7  . Smokeless tobacco: Never Used  Substance and Sexual Activity  . Alcohol use: No  . Drug use: No  . Sexual activity: Yes    Partners: Male    Birth control/protection: None  Other Topics Concern  . Not on file  Social History Narrative  . Not on file   Amoxicillin; Augmentin [amoxicillin-pot clavulanate]; Bactrim; Ceclor [cefaclor]; Cefixime; Erythromycin; Neomycin; and Penicillins    Prenatal  Transfer Tool  Maternal Diabetes: No Genetic Screening: Normal Maternal Ultrasounds/Referrals: Normal except velamentous cord insertion Fetal Ultrasounds or other Referrals:  None Maternal Substance Abuse:  No Significant Maternal Medications:  None Significant Maternal Lab Results: None  Other PNC: uncomplicated.    Vitals:   10/25/17 1931 10/25/17 1945  BP: (!) 142/99 (!) 153/95  Pulse: 72 74  Resp: 18 18     Lungs/Cor:  NAD Abdomen:  soft, gravid Ex:  no cords, erythema SVE:  2/70/-2 FHTs:  130, good STV, NST R Toco:  q 3   A/P   Term labor and GHTN.  Neg proteinuria and labs are normal.  GBS neg.    Kemesha Mosey A

## 2017-10-25 NOTE — MAU Note (Signed)
Pt c/o u/c's q 5 min since 1600. Reports fetal movement, denies any vag bleeding or leaking.

## 2017-10-25 NOTE — Anesthesia Preprocedure Evaluation (Signed)
Anesthesia Evaluation  Patient identified by MRN, date of birth, ID band Patient awake    Reviewed: Allergy & Precautions, Patient's Chart, lab work & pertinent test results  Airway Mallampati: II  TM Distance: >3 FB Neck ROM: Full    Dental no notable dental hx. (+) Teeth Intact   Pulmonary neg pulmonary ROS, former smoker,    Pulmonary exam normal breath sounds clear to auscultation       Cardiovascular hypertension, Normal cardiovascular exam Rhythm:Regular Rate:Normal     Neuro/Psych  Headaches, negative psych ROS   GI/Hepatic negative GI ROS, Neg liver ROS,   Endo/Other  negative endocrine ROS  Renal/GU negative Renal ROS  negative genitourinary   Musculoskeletal negative musculoskeletal ROS (+)   Abdominal   Peds  Hematology  (+) anemia ,   Anesthesia Other Findings   Reproductive/Obstetrics (+) Pregnancy                             Anesthesia Physical Anesthesia Plan  ASA: II  Anesthesia Plan: Epidural   Post-op Pain Management:    Induction:   PONV Risk Score and Plan:   Airway Management Planned: Natural Airway  Additional Equipment:   Intra-op Plan:   Post-operative Plan:   Informed Consent: I have reviewed the patients History and Physical, chart, labs and discussed the procedure including the risks, benefits and alternatives for the proposed anesthesia with the patient or authorized representative who has indicated his/her understanding and acceptance.     Plan Discussed with: Anesthesiologist  Anesthesia Plan Comments:         Anesthesia Quick Evaluation

## 2017-10-26 ENCOUNTER — Encounter (HOSPITAL_COMMUNITY): Payer: Self-pay

## 2017-10-26 LAB — RPR: RPR Ser Ql: NONREACTIVE

## 2017-10-26 MED ORDER — PREPARATION H 50 % EX PADS: 1.0000 | MEDICATED_PAD | Freq: Every day | CUTANEOUS | Status: DC | PRN

## 2017-10-26 MED ORDER — METHYLERGONOVINE MALEATE 0.2 MG PO TABS: 0.2000 mg | ORAL_TABLET | ORAL | Status: DC | PRN

## 2017-10-26 MED ORDER — MEASLES, MUMPS & RUBELLA VAC ~~LOC~~ INJ
0.5000 mL | INJECTION | Freq: Once | SUBCUTANEOUS | Status: DC
  Filled 2017-10-26: qty 0.5

## 2017-10-26 MED ORDER — FERROUS SULFATE 325 (65 FE) MG PO TABS
325.0000 mg | ORAL_TABLET | Freq: Two times a day (BID) | ORAL | Status: DC
  Filled 2017-10-26 (×2): qty 1

## 2017-10-26 MED ORDER — TETANUS-DIPHTH-ACELL PERTUSSIS 5-2.5-18.5 LF-MCG/0.5 IM SUSP: 0.5000 mL | Freq: Once | INTRAMUSCULAR | Status: DC

## 2017-10-26 MED ORDER — PRENATAL MULTIVITAMIN CH
1.0000 | ORAL_TABLET | Freq: Every day | ORAL | Status: DC
  Administered 2017-10-27: 1 via ORAL
  Filled 2017-10-26: qty 1

## 2017-10-26 MED ORDER — IBUPROFEN 800 MG PO TABS
800.0000 mg | ORAL_TABLET | Freq: Three times a day (TID) | ORAL | Status: DC
  Administered 2017-10-26 – 2017-10-27 (×3): 800 mg via ORAL
  Filled 2017-10-26 (×3): qty 1

## 2017-10-26 MED ORDER — WITCH HAZEL-GLYCERIN EX PADS: MEDICATED_PAD | Freq: Every day | CUTANEOUS | Status: DC | PRN

## 2017-10-26 MED ORDER — MAGNESIUM HYDROXIDE 400 MG/5ML PO SUSP: 30.0000 mL | ORAL | Status: DC | PRN

## 2017-10-26 MED ORDER — ONDANSETRON HCL 4 MG PO TABS: 4.0000 mg | ORAL_TABLET | ORAL | Status: DC | PRN

## 2017-10-26 MED ORDER — SODIUM CHLORIDE 0.9% FLUSH: 3.0000 mL | Freq: Two times a day (BID) | INTRAVENOUS | Status: DC

## 2017-10-26 MED ORDER — SODIUM CHLORIDE 0.9 % IV SOLN: 250.0000 mL | INTRAVENOUS | Status: DC | PRN

## 2017-10-26 MED ORDER — ACETAMINOPHEN 325 MG PO TABS: 650.0000 mg | ORAL_TABLET | ORAL | Status: DC | PRN

## 2017-10-26 MED ORDER — SENNOSIDES-DOCUSATE SODIUM 8.6-50 MG PO TABS
2.0000 | ORAL_TABLET | ORAL | Status: DC
  Administered 2017-10-27: 2 via ORAL
  Filled 2017-10-26: qty 2

## 2017-10-26 MED ORDER — SIMETHICONE 80 MG PO CHEW: 80.0000 mg | CHEWABLE_TABLET | ORAL | Status: DC | PRN

## 2017-10-26 MED ORDER — ZOLPIDEM TARTRATE 5 MG PO TABS: 5.0000 mg | ORAL_TABLET | Freq: Every evening | ORAL | Status: DC | PRN

## 2017-10-26 MED ORDER — DIBUCAINE 1 % RE OINT
1.0000 "application " | TOPICAL_OINTMENT | RECTAL | Status: DC | PRN
  Administered 2017-10-26: 1 via RECTAL
  Filled 2017-10-26: qty 28

## 2017-10-26 MED ORDER — METHYLERGONOVINE MALEATE 0.2 MG/ML IJ SOLN: 0.2000 mg | INTRAMUSCULAR | Status: DC | PRN

## 2017-10-26 MED ORDER — METHYLERGONOVINE MALEATE 0.2 MG/ML IJ SOLN
INTRAMUSCULAR | Status: AC
  Administered 2017-10-26: 0.2 mg
  Filled 2017-10-26: qty 1

## 2017-10-26 MED ORDER — COCONUT OIL OIL: 1.0000 "application " | TOPICAL_OIL | Status: DC | PRN

## 2017-10-26 MED ORDER — BENZOCAINE-MENTHOL 20-0.5 % EX AERO
1.0000 "application " | INHALATION_SPRAY | CUTANEOUS | Status: DC | PRN
  Administered 2017-10-26: 1 via TOPICAL
  Filled 2017-10-26: qty 56

## 2017-10-26 MED ORDER — HYDROCORTISONE 2.5 % RE CREA
1.0000 "application " | TOPICAL_CREAM | Freq: Two times a day (BID) | RECTAL | Status: DC
  Administered 2017-10-27: 1 via RECTAL
  Filled 2017-10-26: qty 28.35

## 2017-10-26 MED ORDER — DIPHENHYDRAMINE HCL 25 MG PO CAPS: 25.0000 mg | ORAL_CAPSULE | Freq: Four times a day (QID) | ORAL | Status: DC | PRN

## 2017-10-26 MED ORDER — OXYCODONE-ACETAMINOPHEN 5-325 MG PO TABS: 2.0000 | ORAL_TABLET | ORAL | Status: DC | PRN

## 2017-10-26 MED ORDER — IBUPROFEN 800 MG PO TABS
800.0000 mg | ORAL_TABLET | Freq: Three times a day (TID) | ORAL | Status: DC | PRN
  Administered 2017-10-26: 800 mg via ORAL
  Filled 2017-10-26: qty 1

## 2017-10-26 MED ORDER — SODIUM CHLORIDE 0.9% FLUSH: 3.0000 mL | INTRAVENOUS | Status: DC | PRN

## 2017-10-26 MED ORDER — WITCH HAZEL-GLYCERIN EX PADS
1.0000 "application " | MEDICATED_PAD | CUTANEOUS | Status: DC | PRN
  Administered 2017-10-26: 1 via TOPICAL

## 2017-10-26 MED ORDER — HYDROCORTISONE ACETATE 25 MG RE SUPP
25.0000 mg | Freq: Two times a day (BID) | RECTAL | Status: DC
  Administered 2017-10-26 – 2017-10-27 (×3): 25 mg via RECTAL
  Filled 2017-10-26 (×5): qty 1

## 2017-10-26 MED ORDER — ONDANSETRON HCL 4 MG/2ML IJ SOLN: 4.0000 mg | INTRAMUSCULAR | Status: DC | PRN

## 2017-10-26 NOTE — Anesthesia Postprocedure Evaluation (Signed)
Anesthesia Post Note  Patient: Brooke Hale  Procedure(s) Performed: AN AD HOC LABOR EPIDURAL     Patient location during evaluation: Mother Baby Anesthesia Type: Epidural Level of consciousness: awake, awake and alert, oriented and patient cooperative Pain management: pain level controlled Vital Signs Assessment: post-procedure vital signs reviewed and stable Respiratory status: spontaneous breathing, nonlabored ventilation and respiratory function stable Cardiovascular status: stable Postop Assessment: no headache, no backache, patient able to bend at knees and no apparent nausea or vomiting Anesthetic complications: no    Last Vitals:  Vitals:   10/26/17 1145 10/26/17 1245  BP: 135/83 133/76  Pulse: 70 69  Resp: 18 18  Temp: 36.9 C 36.4 C  SpO2:      Last Pain:  Vitals:   10/26/17 1245  TempSrc: Oral  PainSc: 0-No pain   Pain Goal: Patients Stated Pain Goal: 7 (10/25/17 2048)               Andersyn Fragoso L

## 2017-10-26 NOTE — Progress Notes (Signed)
Dr. Philis Pique was notified that patient did not have HIV or rubella labs in her prenatal.  Dr. Philis Pique looked them up while on the phone with RN and verified that HIV is nonreactive and rubella is immune.  She stated she would pass along for Dr. Harrington Challenger to print off for [place] tomorrow.

## 2017-10-26 NOTE — Progress Notes (Signed)
Dr. Philis Pique notified of elevated blood pressure 143/83 and 147/80 with no additional symptoms.  Dr. Philis Pique gave new parameters of 160/110 for notification of elevated pressures.  Will continue to monitor.

## 2017-10-26 NOTE — Progress Notes (Signed)
Pt comfortable with epidural.  Vitals:   10/26/17 0530 10/26/17 0601 10/26/17 0630 10/26/17 0700  BP: 117/76 (!) 156/93 124/77 115/77  Pulse: 75 66 66 68  Resp: 18 18 16 18   Temp: 98.8 F (37.1 C)     TempSrc: Oral     Weight:      Height:       FHTs 120s, gSTV, NST R Toco q 2-3 SVE 4-5/80/-2, AROM clear. A/p Latent phase labor with new onset of GHTN.  Continue induction.

## 2017-10-27 LAB — CBC
HEMATOCRIT: 31.9 % — AB (ref 36.0–46.0)
HEMOGLOBIN: 10.4 g/dL — AB (ref 12.0–15.0)
MCH: 28.3 pg (ref 26.0–34.0)
MCHC: 32.6 g/dL (ref 30.0–36.0)
MCV: 86.9 fL (ref 78.0–100.0)
Platelets: 157 10*3/uL (ref 150–400)
RBC: 3.67 MIL/uL — AB (ref 3.87–5.11)
RDW: 14.1 % (ref 11.5–15.5)
WBC: 10.5 10*3/uL (ref 4.0–10.5)

## 2017-10-27 MED ORDER — HYDROCODONE-ACETAMINOPHEN 5-325 MG PO TABS: ORAL_TABLET | ORAL | 0 refills | Status: DC

## 2017-10-27 MED ORDER — IBUPROFEN 600 MG PO TABS: 600.0000 mg | ORAL_TABLET | Freq: Four times a day (QID) | ORAL | 0 refills | Status: DC | PRN

## 2017-10-27 NOTE — Progress Notes (Signed)
Post Partum Day 1 Subjective: no complaints, up ad lib, voiding, tolerating PO and + flatus  Pt with protruding umbilical skin that has been present throughout the pregnancy. Has noted area to be more tender with coughing. Small defect in underlying fascia noted. No abdominal contents protruding through. No incarceration.   Objective: Blood pressure 133/69, pulse (!) 51, temperature 97.8 F (36.6 C), temperature source Oral, resp. rate 18, height 5\' 3"  (1.6 m), weight 75.3 kg (166 lb), SpO2 99 %, unknown if currently breastfeeding.  Physical Exam:  General: alert, cooperative and appears stated age Lochia: appropriate Uterine Fundus: firm Umbilicus with protruding skin. No abdominal contents contained. NOn-tender  Recent Labs    10/25/17 2015 10/27/17 0526  HGB 11.1* 10.4*  HCT 33.5* 31.9*    Assessment/Plan: Discharge home  Desires neonatal circumcision, R/B/A of procedure discussed at length. Pt understands that neonatal circumcision is not considered medically necessary and is elective. The risks include, but are not limited to bleeding, infection, damage to the penis, development of scar tissue, and having to have it redone at a later date. Pt understands theses risks and wishes to proceed    LOS: 2 days   Vanessa Kick 10/27/2017, 11:20 AM

## 2017-10-27 NOTE — Discharge Summary (Signed)
Obstetric Discharge Summary Reason for Admission: induction of labor Prenatal Procedures: NST and ultrasound Intrapartum Procedures: spontaneous vaginal delivery Postpartum Procedures: none Complications-Operative and Postpartum: none Hemoglobin  Date Value Ref Range Status  10/27/2017 10.4 (L) 12.0 - 15.0 g/dL Final   Hemoglobin, fingerstick  Date Value Ref Range Status  01/30/2014 14.8 12.0 - 16.0 g/dL Final   HCT  Date Value Ref Range Status  10/27/2017 31.9 (L) 36.0 - 46.0 % Final    Physical Exam:  General: alert, cooperative and appears stated age 30: appropriate Uterine Fundus: firm   Discharge Diagnoses: Term Pregnancy-delivered  Discharge Information: Date: 10/27/2017 Activity: pelvic rest Diet: routine Medications: Ibuprofen and Vicodin Condition: improved Instructions: refer to practice specific booklet Discharge to: home Follow-up Information    Bobbye Charleston, MD Follow up in 3 day(s).   Specialty:  Obstetrics and Gynecology Why:  For a BP check Contact information: [address] [place] [address] [phone]        Vanessa Kick, MD Follow up in 4 week(s).   Specialty:  Obstetrics and Gynecology Why:  For a Postpartum evalutation Contact information: [address] [place] [address] [phone]           Newborn Data: Live born female  Birth Weight: 7 lb 8.8 oz (3425 g) APGAR: 66, 9  Newborn Delivery   Birth date/time:  10/26/2017 08:26:00 Delivery type:  Vaginal, Spontaneous     Home with mother.  Vanessa Kick 10/27/2017, 11:00 AM

## 2018-03-03 ENCOUNTER — Other Ambulatory Visit: Payer: Self-pay | Admitting: Obstetrics and Gynecology

## 2018-03-04 NOTE — Patient Instructions (Addendum)
Your procedure is scheduled on: Wednesday Mar 10, 2018 at 9:45 am  Enter through the Main Entrance of [facility] at: 8:15 am  Pick up the phone at the desk and dial [phone].  Call this number if you have problems the morning of surgery: [phone].  Remember: Do NOT eat food or drink any liquids after: Midnight on Tuesday May 7  Take these medicines the morning of surgery with a SIP OF WATER: Prozac  STOP ALL VITAMINS, HERBAL MEDICATIONS, AND SUPPLEMENTS NOW  Do NOT wear jewelry (body piercing), metal hair clips/bobby pins, make-up, or nail polish. Do NOT wear lotions, powders, or perfumes.  You may wear deoderant. Do NOT shave for 48 hours prior to surgery. Do NOT bring valuables to the hospital. Contacts, dentures, or bridgework may not be worn into surgery.  Have a responsible adult drive you home and stay with you for 24 hours after your procedure

## 2018-03-05 ENCOUNTER — Other Ambulatory Visit: Payer: Self-pay

## 2018-03-05 ENCOUNTER — Encounter (HOSPITAL_COMMUNITY): Payer: Self-pay

## 2018-03-05 ENCOUNTER — Encounter (HOSPITAL_COMMUNITY)
Admission: RE | Admit: 2018-03-05 | Discharge: 2018-03-05 | Disposition: A | Discharge status: Home / Self Care | Payer: 59 | Source: Ambulatory Visit | Attending: Obstetrics and Gynecology | Admitting: Obstetrics and Gynecology

## 2018-03-05 DIAGNOSIS — Z01812 Encounter for preprocedural laboratory examination: Secondary | ICD-10-CM | POA: Diagnosis present

## 2018-03-05 HISTORY — DX: Major depressive disorder, single episode, unspecified: F32.9

## 2018-03-05 HISTORY — DX: Depression, unspecified: F32.A

## 2018-03-05 LAB — CBC
HCT: 38.3 % (ref 36.0–46.0)
Hemoglobin: 13.1 g/dL (ref 12.0–15.0)
MCH: 29.8 pg (ref 26.0–34.0)
MCHC: 34.2 g/dL (ref 30.0–36.0)
MCV: 87.2 fL (ref 78.0–100.0)
Platelets: 195 10*3/uL (ref 150–400)
RBC: 4.39 MIL/uL (ref 3.87–5.11)
RDW: 12.4 % (ref 11.5–15.5)
WBC: 4.9 10*3/uL (ref 4.0–10.5)

## 2018-03-10 ENCOUNTER — Encounter (HOSPITAL_COMMUNITY)
Admission: RE | Disposition: A | Discharge status: Home / Self Care | Payer: Self-pay | Source: Ambulatory Visit | Attending: Obstetrics and Gynecology

## 2018-03-10 ENCOUNTER — Other Ambulatory Visit: Payer: Self-pay

## 2018-03-10 ENCOUNTER — Encounter (HOSPITAL_COMMUNITY): Payer: Self-pay | Admitting: *Deleted

## 2018-03-10 ENCOUNTER — Ambulatory Visit (HOSPITAL_COMMUNITY): Payer: 59 | Admitting: Anesthesiology

## 2018-03-10 ENCOUNTER — Ambulatory Visit (HOSPITAL_COMMUNITY)
Admission: RE | Admit: 2018-03-10 | Discharge: 2018-03-10 | Disposition: A | Discharge status: Home / Self Care | Payer: 59 | Source: Ambulatory Visit | Attending: Obstetrics and Gynecology | Admitting: Obstetrics and Gynecology

## 2018-03-10 DIAGNOSIS — Z880 Allergy status to penicillin: Secondary | ICD-10-CM | POA: Diagnosis not present

## 2018-03-10 DIAGNOSIS — Z881 Allergy status to other antibiotic agents status: Secondary | ICD-10-CM | POA: Insufficient documentation

## 2018-03-10 DIAGNOSIS — F53 Postpartum depression: Secondary | ICD-10-CM | POA: Diagnosis not present

## 2018-03-10 DIAGNOSIS — Z87891 Personal history of nicotine dependence: Secondary | ICD-10-CM | POA: Insufficient documentation

## 2018-03-10 DIAGNOSIS — Z882 Allergy status to sulfonamides status: Secondary | ICD-10-CM | POA: Insufficient documentation

## 2018-03-10 DIAGNOSIS — Z79899 Other long term (current) drug therapy: Secondary | ICD-10-CM | POA: Insufficient documentation

## 2018-03-10 DIAGNOSIS — Z302 Encounter for sterilization: Secondary | ICD-10-CM | POA: Diagnosis not present

## 2018-03-10 DIAGNOSIS — Z803 Family history of malignant neoplasm of breast: Secondary | ICD-10-CM | POA: Diagnosis not present

## 2018-03-10 DIAGNOSIS — Z888 Allergy status to other drugs, medicaments and biological substances status: Secondary | ICD-10-CM | POA: Diagnosis not present

## 2018-03-10 DIAGNOSIS — O99345 Other mental disorders complicating the puerperium: Secondary | ICD-10-CM | POA: Diagnosis not present

## 2018-03-10 HISTORY — PX: LAPAROSCOPIC TUBAL LIGATION: SHX1937

## 2018-03-10 LAB — PREGNANCY, URINE: PREG TEST UR: NEGATIVE

## 2018-03-10 SURGERY — LIGATION, FALLOPIAN TUBE, LAPAROSCOPIC
Anesthesia: General | Site: Abdomen | Laterality: Bilateral

## 2018-03-10 MED ORDER — MIDAZOLAM HCL 2 MG/2ML IJ SOLN
INTRAMUSCULAR | Status: DC | PRN
  Administered 2018-03-10: 2 mg via INTRAVENOUS

## 2018-03-10 MED ORDER — FENTANYL CITRATE (PF) 100 MCG/2ML IJ SOLN
INTRAMUSCULAR | Status: DC | PRN
  Administered 2018-03-10: 100 ug via INTRAVENOUS

## 2018-03-10 MED ORDER — LIDOCAINE HCL (CARDIAC) PF 100 MG/5ML IV SOSY
PREFILLED_SYRINGE | INTRAVENOUS | Status: AC
  Filled 2018-03-10: qty 5

## 2018-03-10 MED ORDER — SUGAMMADEX SODIUM 200 MG/2ML IV SOLN
INTRAVENOUS | Status: DC | PRN
  Administered 2018-03-10: 123.4 mg via INTRAVENOUS

## 2018-03-10 MED ORDER — GLYCOPYRROLATE 0.2 MG/ML IJ SOLN
INTRAMUSCULAR | Status: AC
  Filled 2018-03-10: qty 1

## 2018-03-10 MED ORDER — SODIUM CHLORIDE 0.9 % IV SOLN
INTRAVENOUS | Status: DC | PRN
  Administered 2018-03-10: 60 mL

## 2018-03-10 MED ORDER — MEPERIDINE HCL 25 MG/ML IJ SOLN: 6.2500 mg | INTRAMUSCULAR | Status: DC | PRN

## 2018-03-10 MED ORDER — SUGAMMADEX SODIUM 200 MG/2ML IV SOLN
INTRAVENOUS | Status: AC
  Filled 2018-03-10: qty 2

## 2018-03-10 MED ORDER — LACTATED RINGERS IV SOLN: INTRAVENOUS | Status: DC

## 2018-03-10 MED ORDER — ROCURONIUM BROMIDE 100 MG/10ML IV SOLN
INTRAVENOUS | Status: AC
  Filled 2018-03-10: qty 1

## 2018-03-10 MED ORDER — PROMETHAZINE HCL 25 MG/ML IJ SOLN: 6.2500 mg | INTRAMUSCULAR | Status: DC | PRN

## 2018-03-10 MED ORDER — EPHEDRINE 5 MG/ML INJ
INTRAVENOUS | Status: AC
  Filled 2018-03-10: qty 10

## 2018-03-10 MED ORDER — ROCURONIUM BROMIDE 100 MG/10ML IV SOLN
INTRAVENOUS | Status: DC | PRN
  Administered 2018-03-10: 40 mg via INTRAVENOUS

## 2018-03-10 MED ORDER — PROPOFOL 10 MG/ML IV BOLUS
INTRAVENOUS | Status: DC | PRN
  Administered 2018-03-10: 150 mg via INTRAVENOUS

## 2018-03-10 MED ORDER — ESMOLOL HCL 100 MG/10ML IV SOLN
INTRAVENOUS | Status: DC | PRN
  Administered 2018-03-10: 10 mg via INTRAVENOUS

## 2018-03-10 MED ORDER — DEXAMETHASONE SODIUM PHOSPHATE 10 MG/ML IJ SOLN
INTRAMUSCULAR | Status: DC | PRN
  Administered 2018-03-10: 4 mg via INTRAVENOUS

## 2018-03-10 MED ORDER — OXYCODONE-ACETAMINOPHEN 5-325 MG PO TABS: 1.0000 | ORAL_TABLET | ORAL | 0 refills | Status: DC | PRN

## 2018-03-10 MED ORDER — ROPIVACAINE HCL 5 MG/ML IJ SOLN
INTRAMUSCULAR | Status: AC
  Filled 2018-03-10: qty 30

## 2018-03-10 MED ORDER — DEXAMETHASONE SODIUM PHOSPHATE 4 MG/ML IJ SOLN
INTRAMUSCULAR | Status: AC
  Filled 2018-03-10: qty 1

## 2018-03-10 MED ORDER — KETOROLAC TROMETHAMINE 30 MG/ML IJ SOLN
INTRAMUSCULAR | Status: AC
  Filled 2018-03-10: qty 1

## 2018-03-10 MED ORDER — PROPOFOL 10 MG/ML IV BOLUS
INTRAVENOUS | Status: AC
  Filled 2018-03-10: qty 20

## 2018-03-10 MED ORDER — GLYCOPYRROLATE 0.2 MG/ML IJ SOLN
INTRAMUSCULAR | Status: DC | PRN
  Administered 2018-03-10: 0.1 mg via INTRAVENOUS

## 2018-03-10 MED ORDER — FENTANYL CITRATE (PF) 250 MCG/5ML IJ SOLN
INTRAMUSCULAR | Status: AC
  Filled 2018-03-10: qty 5

## 2018-03-10 MED ORDER — LIDOCAINE HCL (CARDIAC) PF 100 MG/5ML IV SOSY
PREFILLED_SYRINGE | INTRAVENOUS | Status: DC | PRN
  Administered 2018-03-10: 100 mg via INTRAVENOUS

## 2018-03-10 MED ORDER — LACTATED RINGERS IV SOLN
INTRAVENOUS | Status: DC
  Administered 2018-03-10 (×2): via INTRAVENOUS

## 2018-03-10 MED ORDER — SCOPOLAMINE 1 MG/3DAYS TD PT72
1.0000 | MEDICATED_PATCH | Freq: Once | TRANSDERMAL | Status: DC
  Administered 2018-03-10: 1.5 mg via TRANSDERMAL

## 2018-03-10 MED ORDER — SCOPOLAMINE 1 MG/3DAYS TD PT72
MEDICATED_PATCH | TRANSDERMAL | Status: AC
  Filled 2018-03-10: qty 1

## 2018-03-10 MED ORDER — OXYCODONE HCL 5 MG PO TABS
5.0000 mg | ORAL_TABLET | Freq: Once | ORAL | Status: AC | PRN
  Administered 2018-03-10: 5 mg via ORAL

## 2018-03-10 MED ORDER — MIDAZOLAM HCL 2 MG/2ML IJ SOLN
INTRAMUSCULAR | Status: AC
  Filled 2018-03-10: qty 2

## 2018-03-10 MED ORDER — OXYCODONE HCL 5 MG/5ML PO SOLN: 5.0000 mg | Freq: Once | ORAL | Status: AC | PRN

## 2018-03-10 MED ORDER — SODIUM CHLORIDE 0.9 % IJ SOLN
INTRAMUSCULAR | Status: AC
  Filled 2018-03-10: qty 50

## 2018-03-10 MED ORDER — ONDANSETRON HCL 4 MG/2ML IJ SOLN
INTRAMUSCULAR | Status: AC
  Filled 2018-03-10: qty 2

## 2018-03-10 MED ORDER — EPHEDRINE SULFATE 50 MG/ML IJ SOLN
INTRAMUSCULAR | Status: DC | PRN
  Administered 2018-03-10: 5 mg via INTRAVENOUS

## 2018-03-10 MED ORDER — KETOROLAC TROMETHAMINE 30 MG/ML IJ SOLN: 30.0000 mg | Freq: Once | INTRAMUSCULAR | Status: DC | PRN

## 2018-03-10 MED ORDER — OXYCODONE HCL 5 MG PO TABS
ORAL_TABLET | ORAL | Status: AC
  Filled 2018-03-10: qty 1

## 2018-03-10 MED ORDER — KETOROLAC TROMETHAMINE 30 MG/ML IJ SOLN
INTRAMUSCULAR | Status: DC | PRN
  Administered 2018-03-10: 30 mg via INTRAVENOUS

## 2018-03-10 MED ORDER — ONDANSETRON HCL 4 MG/2ML IJ SOLN
INTRAMUSCULAR | Status: DC | PRN
  Administered 2018-03-10: 4 mg via INTRAVENOUS

## 2018-03-10 MED ORDER — HYDROMORPHONE HCL 1 MG/ML IJ SOLN: 0.2500 mg | INTRAMUSCULAR | Status: DC | PRN

## 2018-03-10 SURGICAL SUPPLY — 23 items
CATH ROBINSON RED A/P 16FR (CATHETERS) ×3 IMPLANT
DERMABOND ADVANCED (GAUZE/BANDAGES/DRESSINGS) ×2
DERMABOND ADVANCED .7 DNX12 (GAUZE/BANDAGES/DRESSINGS) ×1 IMPLANT
DRSG OPSITE POSTOP 3X4 (GAUZE/BANDAGES/DRESSINGS) ×3 IMPLANT
DURAPREP 26ML APPLICATOR (WOUND CARE) ×3 IMPLANT
GLOVE BIO SURGEON STRL SZ7 (GLOVE) ×6 IMPLANT
GLOVE BIOGEL PI IND STRL 7.0 (GLOVE) ×1 IMPLANT
GLOVE BIOGEL PI INDICATOR 7.0 (GLOVE) ×2
GOWN STRL REUS W/TWL LRG LVL3 (GOWN DISPOSABLE) ×9 IMPLANT
LIGASURE VESSEL 5MM BLUNT TIP (ELECTROSURGICAL) ×3 IMPLANT
NEEDLE INSUFFLATION 120MM (ENDOMECHANICALS) ×3 IMPLANT
PACK LAPAROSCOPY BASIN (CUSTOM PROCEDURE TRAY) ×3 IMPLANT
PACK TRENDGUARD 450 HYBRID PRO (MISCELLANEOUS) ×1 IMPLANT
PROTECTOR NERVE ULNAR (MISCELLANEOUS) ×3 IMPLANT
SUT VIC AB 2-0 UR6 27 (SUTURE) ×3 IMPLANT
SUT VICRYL RAPIDE 3 0 (SUTURE) ×3 IMPLANT
SYR 50ML LL SCALE MARK (SYRINGE) ×3 IMPLANT
SYR 5ML LL (SYRINGE) ×3 IMPLANT
TOWEL OR 17X24 6PK STRL BLUE (TOWEL DISPOSABLE) ×6 IMPLANT
TRENDGUARD 450 HYBRID PRO PACK (MISCELLANEOUS) ×3
TROCAR OPTI TIP 5M 100M (ENDOMECHANICALS) ×3 IMPLANT
TROCAR XCEL DIL TIP R 11M (ENDOMECHANICALS) ×3 IMPLANT
WARMER LAPAROSCOPE (MISCELLANEOUS) ×3 IMPLANT

## 2018-03-10 NOTE — Transfer of Care (Signed)
Immediate Anesthesia Transfer of Care Note  Patient: Brooke Hale  Procedure(s) Performed: LAPAROSCOPIC Bilateral Salpingectomy (Bilateral Abdomen)  Patient Location: PACU  Anesthesia Type:General  Level of Consciousness: awake, alert  and oriented  Airway & Oxygen Therapy: Patient Spontanous Breathing and Patient connected to nasal cannula oxygen  Post-op Assessment: Report given to RN, Post -op Vital signs reviewed and stable and Patient moving all extremities  Post vital signs: Reviewed and stable  Last Vitals:  Vitals Value Taken Time  BP 147/104 03/10/2018 11:05 AM  Temp    Pulse 84 03/10/2018 11:06 AM  Resp 8 03/10/2018 11:06 AM  SpO2 100 % 03/10/2018 11:06 AM  Vitals shown include unvalidated device data.  Last Pain:  Vitals:   03/10/18 0839  TempSrc: Oral  PainSc: 0-No pain      Patients Stated Pain Goal: 5 (16/43/53 9122)  Complications: No apparent anesthesia complications

## 2018-03-10 NOTE — H&P (Signed)
32 y.o. multip desires permanent sterilization. Previously:"Pt had heard of a relative that had a problem with the clip BTL. Uncertain what that problem was. \cb3 D/w pt benefit of filschie clips - shorter operating time and less risk of bleeding than with salpingectomy. Also d/w pt risks are extremely low for future problems except for 11/1998 chance of pregnancy and increase risk of ectopic if becomes pregnant.\cb3 Then we reviewed pt's family history. She has a MGM that had a very early breast cancer. Pt's mom is fine. She has not yet had My Risk testing. With the possibility that she could be at increased risk of ovarian ca. In that case, I agree with her that a bilateral salpingectomy at this point is very reasonable. She will undergo counseling and My Risk testing at postop visit 2 weeks after the operation."  Past Medical History:  Diagnosis Date  . Depression    post partum, keeping on medicine  . Hemorrhoids   . Migraine   . PID (pelvic inflammatory disease) 08/2012   tx'd for PID  . Pregnancy induced hypertension    with pregnancy   Past Surgical History:  Procedure Laterality Date  . HEMORRHOID SURGERY N/A 07/12/2015   Procedure: EXTERNAL HEMORRHOIDECTOMY;  Surgeon: Autumn Messing III, MD;  Location: [facility];  Service: General;  Laterality: N/A;  . TONSILECTOMY, ADENOIDECTOMY, BILATERAL MYRINGOTOMY AND TUBES    . TONSILLECTOMY      Social History   Socioeconomic History  . Marital status: Married    Spouse name: Not on file  . Number of children: Not on file  . Years of education: Not on file  . Highest education level: Not on file  Occupational History  . Not on file  Social Needs  . Financial resource strain: Not on file  . Food insecurity:    Worry: Not on file    Inability: Not on file  . Transportation needs:    Medical: Not on file    Non-medical: Not on file  Tobacco Use  . Smoking status: Former Smoker    Last attempt to quit: 02/09/2014    Years  since quitting: 4.0  . Smokeless tobacco: Never Used  Substance and Sexual Activity  . Alcohol use: No  . Drug use: No  . Sexual activity: Yes    Partners: Male    Birth control/protection: None  Lifestyle  . Physical activity:    Days per week: Not on file    Minutes per session: Not on file  . Stress: Not on file  Relationships  . Social connections:    Talks on phone: Not on file    Gets together: Not on file    Attends religious service: Not on file    Active member of club or organization: Not on file    Attends meetings of clubs or organizations: Not on file    Relationship status: Not on file  . Intimate partner violence:    Fear of current or ex partner: Not on file    Emotionally abused: Not on file    Physically abused: Not on file    Forced sexual activity: Not on file  Other Topics Concern  . Not on file  Social History Narrative  . Not on file    No current facility-administered medications on file prior to encounter.    Current Outpatient Medications on File Prior to Encounter  Medication Sig Dispense Refill  . FLUoxetine (PROZAC) 20 MG capsule Take 20 mg by  mouth daily.  4    Allergies  Allergen Reactions  . Amoxicillin Hives  . Augmentin [Amoxicillin-Pot Clavulanate] Hives and Itching  . Bactrim Hives, Itching and Nausea Only  . Ceclor [Cefaclor] Hives  . Cefixime Hives and Nausea And Vomiting  . Erythromycin Hives  . Penicillins Hives and Itching    Has patient had a PCN reaction causing immediate rash, facial/tongue/throat swelling, SOB or lightheadedness with hypotension: no Has patient had a PCN reaction causing severe rash involving mucus membranes or skin necrosis: no Has patient had a PCN reaction that required hospitalization: no Has patient had a PCN reaction occurring within the last 10 years: no If all of the above answers are "NO", then may proceed with Cephalosporin use.     There were no vitals filed for this visit.  Lungs: clear  to ascultation Cor:  RRR Abdomen:  soft, nontender, nondistended. Ex:  no cords, erythema Pelvic:   Vulva: no masses, no atrophy, no lesions\ls1   Bladder/Urethra: normal meatus, no urethral discharge, no urethral mass, bladder non distended\ls1  Vagina no tenderness, no erythema, no abnormal vaginal discharge, no vesicle(s) or ulcers, no cystocele, no rectocele\ls1   Cervix: grossly normal, no discharge, no cervical motion tenderness\ls1   Uterus: normal size, normal shape, midline, mobile, non-tender, no uterine prolapse\ls1   Adnexa/Parametria: no parametrial tenderness, no parametrial mass, no adnexal tenderness, no ovarian mass  A:  For bilateral salpingectomy for sterilization.    P: All risks, benefits and alternatives d/w patient and she desires to proceed.  Patient will have SCDs during the operation.     Graysyn Bache A

## 2018-03-10 NOTE — Anesthesia Preprocedure Evaluation (Signed)
Anesthesia Evaluation  Patient identified by MRN, date of birth, ID band Patient awake    Reviewed: Allergy & Precautions, NPO status , Patient's Chart, lab work & pertinent test results  Airway Mallampati: II  TM Distance: >3 FB Neck ROM: Full    Dental no notable dental hx. (+) Teeth Intact   Pulmonary neg pulmonary ROS, former smoker,    Pulmonary exam normal breath sounds clear to auscultation       Cardiovascular hypertension, Normal cardiovascular exam Rhythm:Regular Rate:Normal     Neuro/Psych  Headaches, negative psych ROS   GI/Hepatic negative GI ROS, Neg liver ROS,   Endo/Other  negative endocrine ROS  Renal/GU negative Renal ROS  negative genitourinary   Musculoskeletal negative musculoskeletal ROS (+)   Abdominal   Peds  Hematology  (+) anemia ,   Anesthesia Other Findings   Reproductive/Obstetrics (+) Pregnancy                             Anesthesia Physical  Anesthesia Plan  ASA: II  Anesthesia Plan: General   Post-op Pain Management:    Induction:   PONV Risk Score and Plan: 4 or greater and Ondansetron, Dexamethasone, Midazolam and Scopolamine patch - Pre-op  Airway Management Planned: Oral ETT  Additional Equipment:   Intra-op Plan:   Post-operative Plan: Extubation in OR  Informed Consent: I have reviewed the patients History and Physical, chart, labs and discussed the procedure including the risks, benefits and alternatives for the proposed anesthesia with the patient or authorized representative who has indicated his/her understanding and acceptance.   Dental advisory given  Plan Discussed with: CRNA  Anesthesia Plan Comments:        Anesthesia Quick Evaluation

## 2018-03-10 NOTE — Discharge Instructions (Signed)
DISCHARGE INSTRUCTIONS: Laparoscopy  The following instructions have been prepared to help you care for yourself upon your return home today.  Wound care:  Do not get the incision wet for the first 24 hours. The incision should be kept clean and dry.  The Band-Aids or dressings may be removed the day after surgery.  Should the incision become sore, red, and swollen after the first week, check with your doctor.  Personal hygiene:  Shower the day after your procedure.  Activity and limitations:  Do NOT drive or operate any equipment today.  Do NOT lift anything more than 15 pounds for 2-3 weeks after surgery.  Do NOT rest in bed all day.  Walking is encouraged. Walk each day, starting slowly with 5-minute walks 3 or 4 times a day. Slowly increase the length of your walks.  Walk up and down stairs slowly.  Do NOT do strenuous activities, such as golfing, playing tennis, bowling, running, biking, weight lifting, gardening, mowing, or vacuuming for 2-4 weeks. Ask your doctor when it is okay to start.  Diet: Eat a light meal as desired this evening. You may resume your usual diet tomorrow.  Return to work: This is dependent on the type of work you do. For the most part you can return to a desk job within a week of surgery. If you are more active at work, please discuss this with your doctor.  What to expect after your surgery: You may have a slight burning sensation when you urinate on the first day. You may have a very small amount of blood in the urine. Expect to have a small amount of vaginal discharge/light bleeding for 1-2 weeks. It is not unusual to have abdominal soreness and bruising for up to 2 weeks. You may be tired and need more rest for about 1 week. You may experience shoulder pain for 24-72 hours. Lying flat in bed may relieve it.  Call your doctor for any of the following:  Develop a fever of 100.4 or greater  Inability to urinate 6 hours after discharge from  hospital  Severe pain not relieved by pain medications  Persistent of heavy bleeding at incision site  Redness or swelling around incision site after a week  Increasing nausea or vomiting  No ibuprofen/Motrin/Advil products until 5:00pm 03/10/18.  Post Anesthesia Home Care Instructions  Activity: Get plenty of rest for the remainder of the day. A responsible individual must stay with you for 24 hours following the procedure.  For the next 24 hours, DO NOT: -Drive a car -Paediatric nurse -Drink alcoholic beverages -Take any medication unless instructed by your physician -Make any legal decisions or sign important papers.  Meals: Start with liquid foods such as gelatin or soup. Progress to regular foods as tolerated. Avoid greasy, spicy, heavy foods. If nausea and/or vomiting occur, drink only clear liquids until the nausea and/or vomiting subsides. Call your physician if vomiting continues.  Special Instructions/Symptoms: Your throat may feel dry or sore from the anesthesia or the breathing tube placed in your throat during surgery. If this causes discomfort, gargle with warm salt water. The discomfort should disappear within 24 hours.  If you had a scopolamine patch placed behind your ear for the management of post- operative nausea and/or vomiting:  1. The medication in the patch is effective for 72 hours, after which it should be removed.  Wrap patch in a tissue and discard in the trash. Wash hands thoroughly with soap and water. 2. You may  earlier than 72 hours if you experience unpleasant side effects which may include dry mouth, dizziness or visual disturbances. 3. Avoid touching the patch. Wash your hands with soap and water after contact with the patch.   

## 2018-03-10 NOTE — Progress Notes (Addendum)
There has been no change in the patients history, status or exam since the history and physical.  Vitals:   03/10/18 0839  BP: 120/83  Pulse: (!) 58  Resp: 16  Temp: 98.4 F (36.9 C)  TempSrc: Oral  SpO2: 100%   Results for orders placed or performed during the hospital encounter of 03/10/18 (from the past 24 hour(s))  Pregnancy, urine     Status: None   Collection Time: 03/10/18  8:25 AM  Result Value Ref Range   Preg Test, Ur NEGATIVE NEGATIVE   Brooke Hale

## 2018-03-10 NOTE — Anesthesia Procedure Notes (Signed)
Procedure Name: Intubation Date/Time: 03/10/2018 10:07 AM Performed by: Hewitt Blade, CRNA Pre-anesthesia Checklist: Patient identified, Emergency Drugs available, Suction available and Patient being monitored Patient Re-evaluated:Patient Re-evaluated prior to induction Oxygen Delivery Method: Circle system utilized Preoxygenation: Pre-oxygenation with 100% oxygen Induction Type: IV induction Ventilation: Mask ventilation without difficulty Laryngoscope Size: Mac and 3 Grade View: Grade I Tube type: Oral Tube size: 7.0 mm Number of attempts: 1 Airway Equipment and Method: Stylet Placement Confirmation: ETT inserted through vocal cords under direct vision,  positive ETCO2 and breath sounds checked- equal and bilateral Secured at: 21 cm Tube secured with: Tape Dental Injury: Teeth and Oropharynx as per pre-operative assessment

## 2018-03-10 NOTE — Op Note (Signed)
03/10/2018  10:48 AM  PATIENT:  Brooke Hale  32 y.o. female  PRE-OPERATIVE DIAGNOSIS:  DESIRES STERILIZATION  POST-OPERATIVE DIAGNOSIS:  DESIRES STERILIZATION  PROCEDURE:  Procedure(s): LAPAROSCOPIC Bilateral Salpingectomy (Bilateral)  SURGEON:  Surgeon(s) and Role:    * Bobbye Charleston, MD - Primary  ANESTHESIA:   general  EBL: minimal  LOCAL MEDICATIONS USED:  OTHER Ropivicaine in pelvis  SPECIMEN:  Source of Specimen:  bilateral tubes  DISPOSITION OF SPECIMEN:  PATHOLOGY  COUNTS:  YES  TOURNIQUET:  * No tourniquets in log *  DICTATION: .Note written in EPIC  PLAN OF CARE: Discharge to home after PACU  PATIENT DISPOSITION:  PACU - hemodynamically stable.   Delay start of Pharmacological VTE agent (>24hrs) due to surgical blood loss or risk of bleeding: not applicable  Complications:  none  Findings: normal uterus, tubes and ovaries.  Technique  After adequate general endotracheal anesthesia was achieved, the patient was prepped and draped in the usual sterile fashion.  A 2 cm incision was made just below the umbilicus and the abdominal wall tented up.  The Veress needle was inserted at a 45 degree angle to the pelvis and no bowel contents or blood were aspirated.  The abdomen was insufflated and the 12 mm trocar placed without complication.  The operative scope was introduced and the above findings noted. A 5 mm trocar was placed above the pubic bone under direct visualization.  The ligasure instrument was introduced and each cornual segment of the tubes was cauterized and cut.  The long grasper was introduced into the operative scope and each tube in succession was tented up.  The mesosalpinx was then incised under the tube with the ligasure until each tube was removed entirely.  Each tube was sent to pathology.   After a quick inspection of the pelvis, Ropivicaine was introduced into the pelvis and all instruments were removed and the abdomen was  desufflated.  The 12 mm faschial incision was closed with a figure of eight stitch of 2-vicryl and the skin was closed with dermabond.  The 5 mm skin was closed with a stitch of 4-0 vicryl R and dermabond.  All instruments were removed from the vagina and the patient returned to the PACU in stable condition.  Taariq Leitz A

## 2018-03-10 NOTE — Brief Op Note (Signed)
03/10/2018  10:48 AM  PATIENT:  Brooke Hale  32 y.o. female  PRE-OPERATIVE DIAGNOSIS:  DESIRES STERILIZATION  POST-OPERATIVE DIAGNOSIS:  DESIRES STERILIZATION  PROCEDURE:  Procedure(s): LAPAROSCOPIC Bilateral Salpingectomy (Bilateral)  SURGEON:  Surgeon(s) and Role:    * Bobbye Charleston, MD - Primary  ANESTHESIA:   general  EBL: minimal  LOCAL MEDICATIONS USED:  OTHER Ropivicaine in pelvis  SPECIMEN:  Source of Specimen:  bilateral tubes  DISPOSITION OF SPECIMEN:  PATHOLOGY  COUNTS:  YES  TOURNIQUET:  * No tourniquets in log *  DICTATION: .Note written in EPIC  PLAN OF CARE: Discharge to home after PACU  PATIENT DISPOSITION:  PACU - hemodynamically stable.   Delay start of Pharmacological VTE agent (>24hrs) due to surgical blood loss or risk of bleeding: not applicable

## 2018-03-11 NOTE — Anesthesia Postprocedure Evaluation (Signed)
Anesthesia Post Note  Patient: Brooke Hale  Procedure(s) Performed: LAPAROSCOPIC Bilateral Salpingectomy (Bilateral Abdomen)     Patient location during evaluation: PACU Anesthesia Type: General Level of consciousness: sedated and patient cooperative Pain management: pain level controlled Vital Signs Assessment: post-procedure vital signs reviewed and stable Respiratory status: spontaneous breathing Cardiovascular status: stable Anesthetic complications: no    Last Vitals:  Vitals:   03/10/18 1230 03/10/18 1310  BP: 128/85 135/83  Pulse: 65 72  Resp: 20 16  Temp: 36.8 C 36.8 C  SpO2: 100% 100%    Last Pain:  Vitals:   03/10/18 1251  TempSrc:   PainSc: [name]

## 2018-03-17 ENCOUNTER — Encounter (HOSPITAL_COMMUNITY): Payer: Self-pay | Admitting: Obstetrics and Gynecology

## 2018-05-05 IMAGING — US US MFM OB DETAIL+14 WK
1 series · 14 of 28 positions shown · non-contrast
Comparison: none

[Series 1: us mfm ob detail+14 wk · 68 acquisitions, 14 frames shown]
[im 3/68]
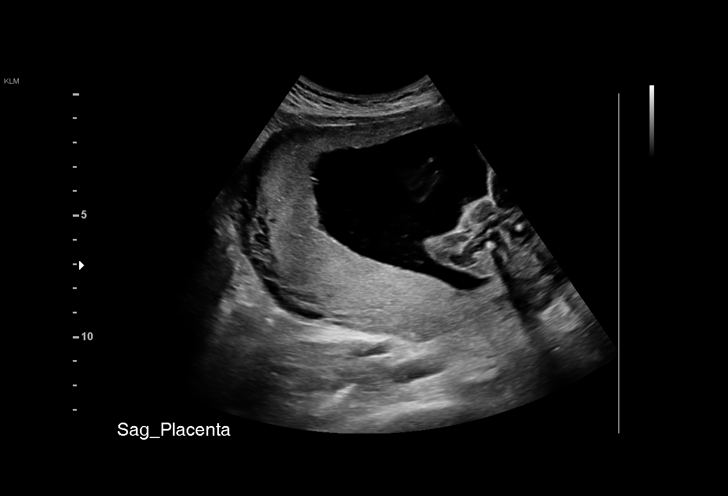
[im 8/68]
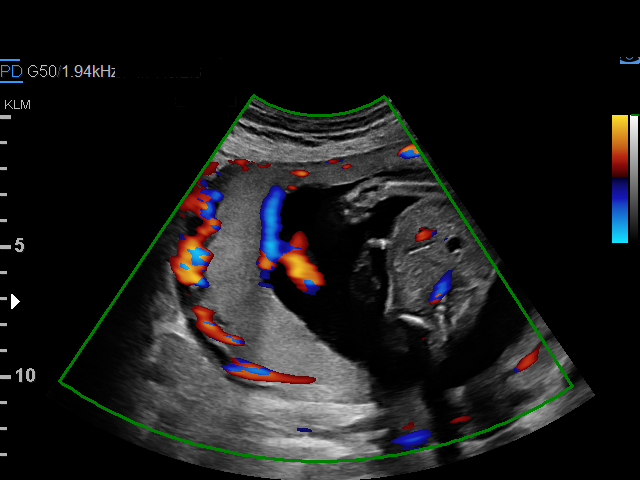
[im 13/68]
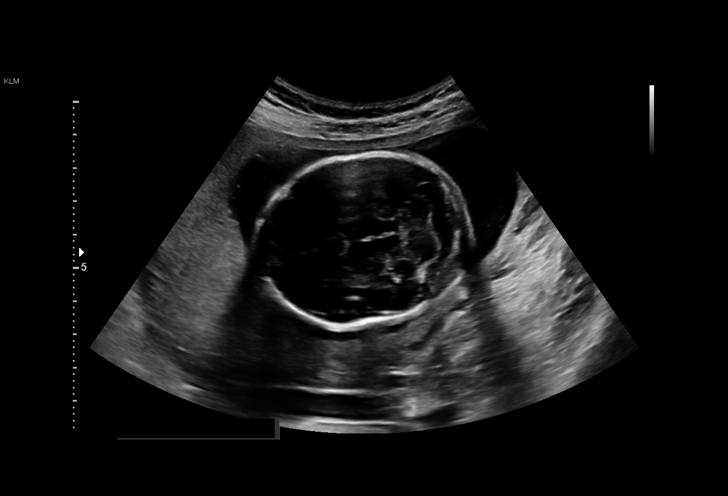
[im 18/68]
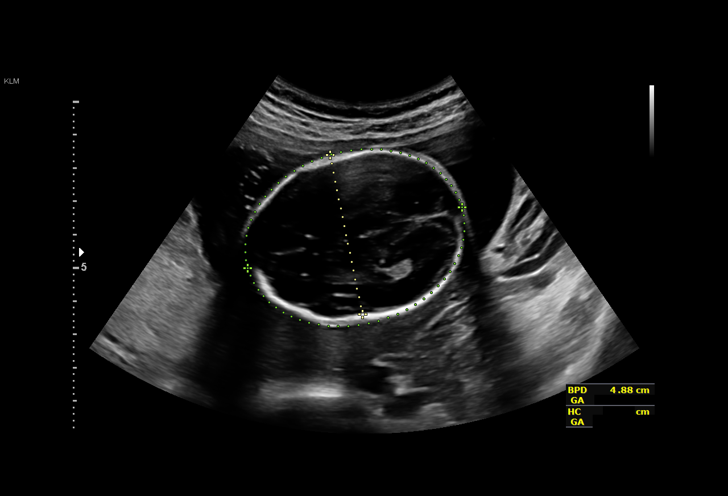
[im 23/68]
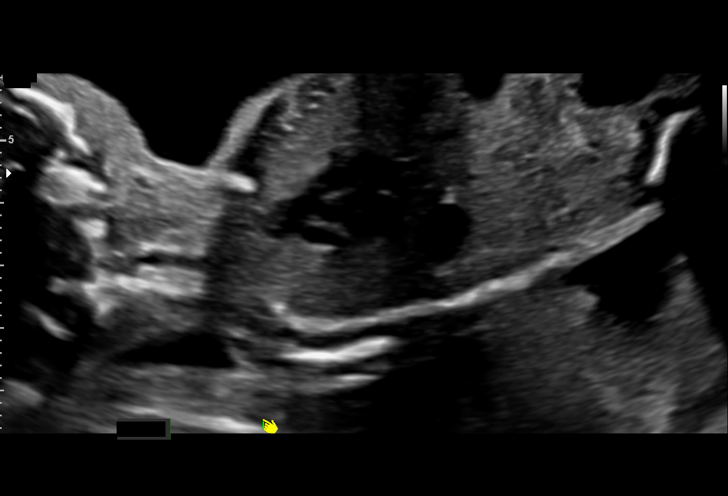
[im 28/68]
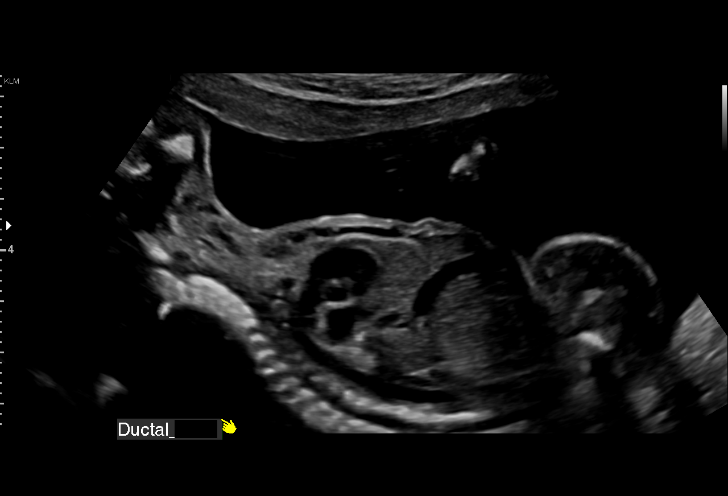
[im 33/68]
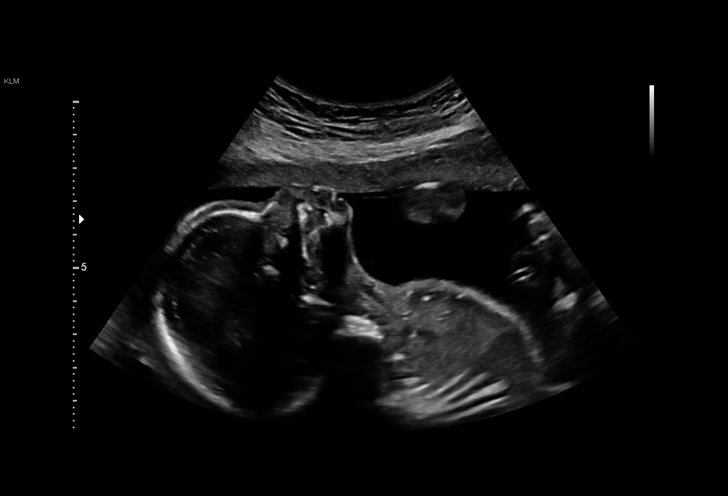
[im 38/68]
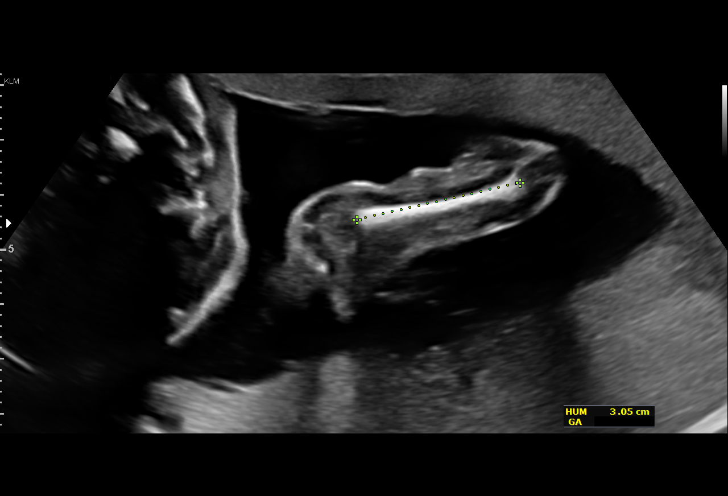
[im 43/68]
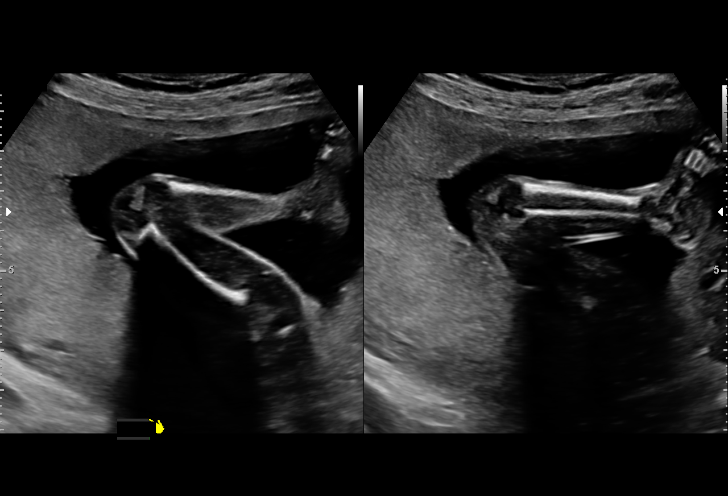
[im 48/68]
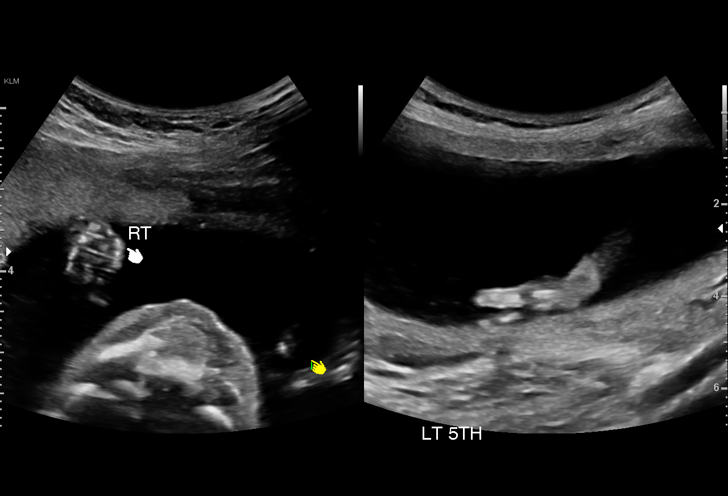
[im 53/68]
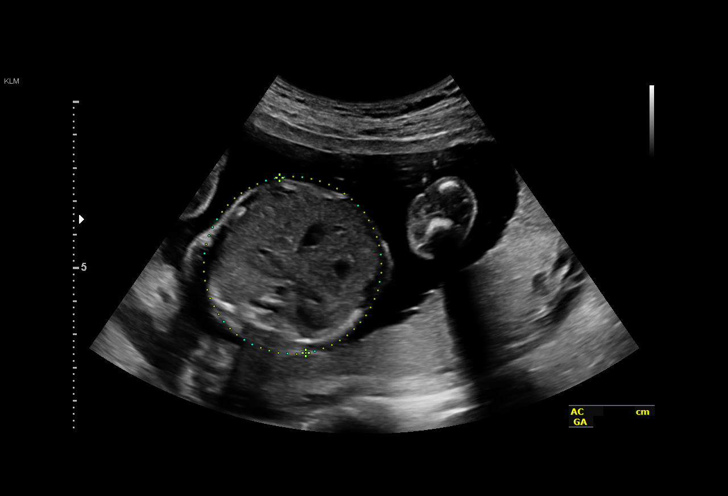
[im 58/68]
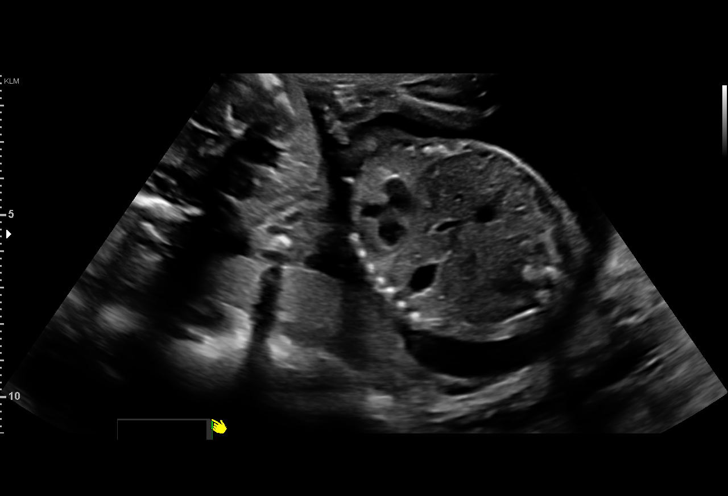
[im 63/68]
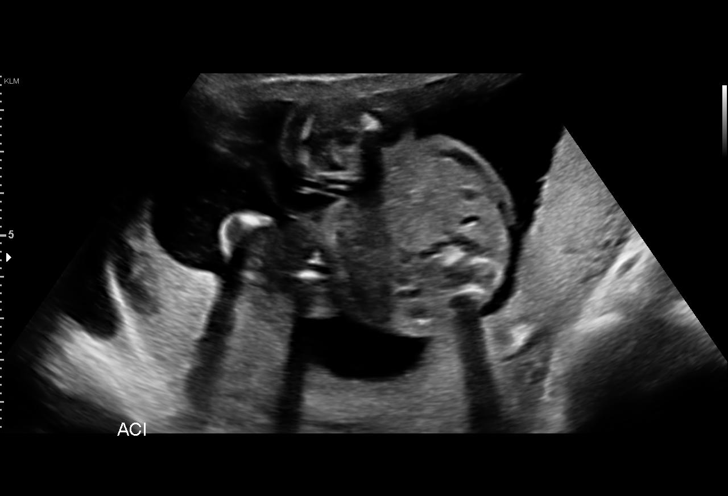
[im 68/68]
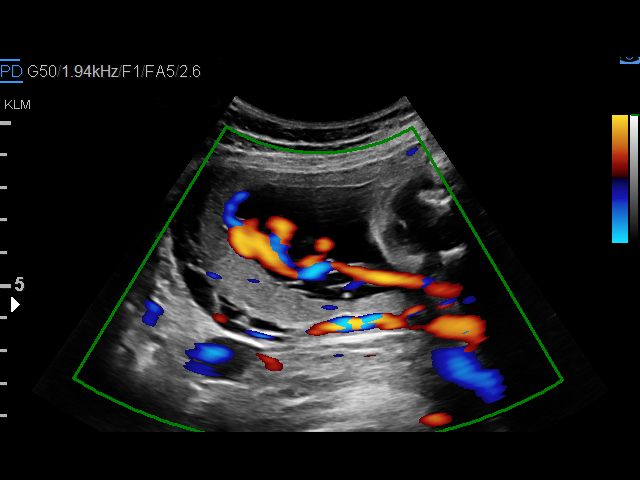

[14 of 28 positions shown; findings below may reference images not displayed]

SOLARTE

1  MUXIGULASHVILI JONJUA               [id number]      [id number]     [id number]
Indications

20 weeks gestation of pregnancy
Encounter for antenatal screening for
malformations
Velamentous insertion of umbilical cord
(Suspected)
OB History

Gravidity:    3         Term:   2        Prem:   0        SAB:   0
TOP:          0       Ectopic:  0        Living: 2
Fetal Evaluation

Num Of Fetuses:     1
Fetal Heart         167
Rate(bpm):
Cardiac Activity:   Observed
Presentation:       Breech
Placenta:           Posterior, above cervical os
P. Cord Insertion:  Visualized

Amniotic Fluid
AFI FV:      Subjectively within normal limits
Biometry

BPD:        49  mm     G. Age:  20w 6d         48  %    CI:        68.58   %   70 - 86
FL/HC:      17.3   %   15.9 -
HC:      189.1  mm     G. Age:  21w 1d         56  %    HC/AC:      1.13       1.06 -
AC:       168   mm     G. Age:  21w 6d         74  %    FL/BPD:     66.9   %
FL:       32.8  mm     G. Age:  20w 2d         22  %    FL/AC:      19.5   %   20 - 24
HUM:      32.9  mm     G. Age:  21w 0d         54  %
CER:      22.3  mm     G. Age:  21w 1d         57  %

Est. FW:     399  gm    0 lb 14 oz      50  %
Gestational Age

U/S Today:     21w 0d                                        EDD:   10/30/17
Best:          20w 6d    Det. By:   Early Ultrasound         EDD:   10/31/17
(04/22/17)
Anatomy

Cranium:               Appears normal         Aortic Arch:            Appears normal
Cavum:                 Appears normal         Ductal Arch:            Appears normal
Ventricles:            Appears normal         Diaphragm:              Appears normal
Choroid Plexus:        Appears normal         Stomach:                Appears normal, left
sided
Cerebellum:            Appears normal         Abdomen:                Appears normal
Posterior Fossa:       Appears normal         Abdominal Wall:         Appears nml (cord
insert, abd wall)
Nuchal Fold:           Not applicable (>20    Cord Vessels:           Appears normal (3
wks GA)                                        vessel cord)
Face:                  Appears normal         Kidneys:                Appear normal
(orbits and profile)
Lips:                  Appears normal         Bladder:                Appears normal
Thoracic:              Appears normal         Spine:                  Not well visualized
Heart:                 Appears normal         Upper Extremities:      Appears normal
(4CH, axis, and situs
RVOT:                  Appears normal         Lower Extremities:      Appears normal
LVOT:                  Appears normal

Other:  Fetus appears to be a male. Heels and 5th digit visualized. Nasal
bone visualized.
Cervix Uterus Adnexa

Cervix
Length:            3.3  cm.
Normal appearance by transabdominal scan.
Impression

Singleton intrauterine pregnancy at 20+6 weeks with
suspected velamentous insertion
Review of the anatomy shows no sonographic markers for
aneuploidy or structural anomalies
However, spine evaluation should be considered suboptimal
secondary to fetal position
The cord insertion appears to be marginal rather than
velamentous
Amniotic fluid volume is normal
Estimated fetal weight is 399g which is growth in the 50th
percentile
Recommendations

Repeat scan in 6 weeks to complete growth and confirm
today's findings

## 2022-08-15 ENCOUNTER — Other Ambulatory Visit (HOSPITAL_BASED_OUTPATIENT_CLINIC_OR_DEPARTMENT_OTHER): Payer: Self-pay

## 2022-08-15 MED ORDER — FLUARIX QUADRIVALENT 0.5 ML IM SUSY
PREFILLED_SYRINGE | INTRAMUSCULAR | 0 refills | Status: DC
  Filled 2022-08-15: qty 0.5, 1d supply, fill #0

## 2022-11-27 DIAGNOSIS — U071 COVID-19: Secondary | ICD-10-CM | POA: Diagnosis not present

## 2022-11-27 DIAGNOSIS — H9202 Otalgia, left ear: Secondary | ICD-10-CM | POA: Diagnosis not present

## 2023-03-05 DIAGNOSIS — F411 Generalized anxiety disorder: Secondary | ICD-10-CM | POA: Diagnosis not present

## 2023-03-05 DIAGNOSIS — R195 Other fecal abnormalities: Secondary | ICD-10-CM | POA: Diagnosis not present

## 2023-03-05 DIAGNOSIS — N9489 Other specified conditions associated with female genital organs and menstrual cycle: Secondary | ICD-10-CM | POA: Diagnosis not present

## 2023-03-05 DIAGNOSIS — R140 Abdominal distension (gaseous): Secondary | ICD-10-CM | POA: Diagnosis not present

## 2023-03-05 DIAGNOSIS — R634 Abnormal weight loss: Secondary | ICD-10-CM | POA: Diagnosis not present

## 2023-04-06 DIAGNOSIS — R55 Syncope and collapse: Secondary | ICD-10-CM | POA: Diagnosis not present

## 2023-05-05 DIAGNOSIS — Z1331 Encounter for screening for depression: Secondary | ICD-10-CM | POA: Diagnosis not present

## 2023-05-05 DIAGNOSIS — R55 Syncope and collapse: Secondary | ICD-10-CM | POA: Diagnosis not present

## 2023-05-13 ENCOUNTER — Ambulatory Visit: Payer: Commercial Managed Care - PPO | Admitting: Neurology

## 2023-05-13 ENCOUNTER — Encounter: Payer: Self-pay | Admitting: Neurology

## 2023-05-13 VITALS — BP 123/89 | HR 66 | Ht 63.0 in | Wt 119.5 lb

## 2023-05-13 DIAGNOSIS — R55 Syncope and collapse: Secondary | ICD-10-CM | POA: Diagnosis not present

## 2023-05-13 NOTE — Patient Instructions (Addendum)
Increase hydration Continue current medications Routine EEG Continue follow-up PCP and cardiology Return as needed

## 2023-05-13 NOTE — Progress Notes (Signed)
[facility]  PATIENT: Brooke Hale DOB: December 26, 1985  REQUESTING CLINICIAN: Rick Duff, PA-C HISTORY FROM: Patient  REASON FOR VISIT: Syncope    HISTORICAL  CHIEF COMPLAINT:  Chief Complaint  Patient presents with   New Patient (Initial Visit)    Rm12, 2 small female children present, syncopal events w/ ZOX:WRUEAVW the past 9 years but got more frequent. On 04/05/23 she fainted in shower and injured back (has photo proof) she didn't get injury checked out.     HISTORY OF PRESENT ILLNESS:  This is a 37 year old woman past medical history of IBS who is presenting with evaluation of syncope.  Patient reports having syncopal episodes for the past 9 years.  With each episode, she will get hot, feels nauseated, feels like she needs to use the bathroom, sometimes her vision will go dark and she will wake up on the floor.  She never have any major confusion after each of the syncopal episode.  Sometimes she can tell the episodes is about to happen and she will lay to the ground and would not pass out.  Her last episode was on June 2 when she was in the shower, she felt like she did not have a warning sign and fell on hurt her back.  Again she has multiple syncopal episodes, most of them occurred during the shower, sometimes she has syncope while waiting at the bus stop, or standing by the stove.  Denies any major injury, no tongue biting or urinary incontinence.  She has seen cardiology and was diagnosed with vasovagal syncope.   OTHER MEDICAL CONDITIONS: IBS    REVIEW OF SYSTEMS: Full 14 system review of systems performed and negative with exception of: As noted in the HPI   ALLERGIES: Allergies  Allergen Reactions   Sulfa Antibiotics Hives   Amoxicillin Hives   Amoxicillin-Pot Clavulanate     Other Reaction(s): Not available, Other (See Comments)  Unknown reaction   Augmentin [Amoxicillin-Pot Clavulanate] Hives and Itching   Bactrim Hives, Itching and  Nausea Only   Cefaclor Hives    Other Reaction(s): Not available   Cefixime Hives and Nausea And Vomiting   Cefixime     Other Reaction(s): Not available   Erythromycin Hives    Other Reaction(s): Not available  childhood   Sulfamethoxazole-Trimethoprim Hives    Other Reaction(s): Not available   Penicillins Hives and Itching    Has patient had a PCN reaction causing immediate rash, facial/tongue/throat swelling, SOB or lightheadedness with hypotension: no  Has patient had a PCN reaction causing severe rash involving mucus membranes or skin necrosis: no  Has patient had a PCN reaction that required hospitalization: no  Has patient had a PCN reaction occurring within the last 10 years: no  If all of the above answers are "NO", then may proceed with Cephalosporin use.  Other Reaction(s): Not available  Other Reaction(s): Other (See Comments), Other (See Comments)  Unknown reaction    HOME MEDICATIONS: Outpatient Medications Prior to Visit  Medication Sig Dispense Refill   dicyclomine (BENTYL) 10 MG capsule Take 10 mg by mouth 4 (four) times daily -  before meals and at bedtime.     LORazepam (ATIVAN) 0.5 MG tablet Take 0.5 mg by mouth every 8 (eight) hours.     FLUoxetine (PROZAC) 20 MG capsule Take 20 mg by mouth daily.  4   influenza vac split quadrivalent PF (FLUARIX QUADRIVALENT) 0.5 ML injection Inject into the muscle. 0.5 mL 0   oxyCODONE-acetaminophen (  PERCOCET/ROXICET) 5-325 MG tablet Take 1 tablet by mouth every 4 (four) hours as needed for severe pain. 30 tablet 0   No facility-administered medications prior to visit.    PAST MEDICAL HISTORY: Past Medical History:  Diagnosis Date   Depression    post partum, keeping on medicine   Hemorrhoids    Migraine    PID (pelvic inflammatory disease) 08/2012   tx'd for PID   Pregnancy induced hypertension    with pregnancy, resolved, no meds currently   SVD (spontaneous vaginal delivery)    x 3    PAST SURGICAL  HISTORY: Past Surgical History:  Procedure Laterality Date   HEMORRHOID SURGERY N/A 07/12/2015   Procedure: EXTERNAL HEMORRHOIDECTOMY;  Surgeon: Chevis Pretty III, MD;  Location: [facility];  Service: General;  Laterality: N/A;   LAPAROSCOPIC TUBAL LIGATION Bilateral 03/10/2018   Procedure: LAPAROSCOPIC Bilateral Salpingectomy;  Surgeon: Carrington Clamp, MD;  Location: [facility];  Service: Gynecology;  Laterality: Bilateral;   TONSILECTOMY, ADENOIDECTOMY, BILATERAL MYRINGOTOMY AND TUBES     TONSILLECTOMY      FAMILY HISTORY: Family History  Problem Relation Age of Onset   Asthma Mother    Breast cancer Maternal Grandmother    Diabetes Maternal Grandfather    Hypertension Maternal Grandfather    Heart failure Maternal Grandfather    Hyperlipidemia Maternal Grandfather    Stroke Maternal Grandfather     SOCIAL HISTORY: Social History   Socioeconomic History   Marital status: Married    Spouse name: james   Number of children: 3   Years of education: Not on file   Highest education level: Bachelor's degree (e.g., BA, AB, BS)  Occupational History   Not on file  Tobacco Use   Smoking status: Former    Packs/day: 0.50    Years: 10.00    Additional pack years: 0.00    Total pack years: 5.00    Types: Cigarettes    Quit date: 02/09/2014    Years since quitting: 9.2   Smokeless tobacco: Never  Vaping Use   Vaping Use: Never used  Substance and Sexual Activity   Alcohol use: No   Drug use: No   Sexual activity: Yes    Partners: Male    Birth control/protection: None  Other Topics Concern   Not on file  Social History Narrative   Not on file   Social Determinants of Health   Financial Resource Strain: Not on file  Food Insecurity: Not on file  Transportation Needs: Not on file  Physical Activity: Not on file  Stress: Not on file  Social Connections: Not on file  Intimate Partner Violence: Not on file    PHYSICAL EXAM  GENERAL  EXAM/CONSTITUTIONAL: Vitals:  Vitals:   05/13/23 1026  BP: 123/89  Pulse: 66  Weight: 119 lb 8 oz (54.2 kg)  Height: 5\' 3"  (1.6 m)   Body mass index is 21.17 kg/m. Wt Readings from Last 3 Encounters:  05/13/23 119 lb 8 oz (54.2 kg)  03/05/18 136 lb (61.7 kg)  10/25/17 166 lb (75.3 kg)   Patient is in no distress; well developed, nourished and groomed; neck is supple  MUSCULOSKELETAL: Gait, strength, tone, movements noted in Neurologic exam below  NEUROLOGIC: MENTAL STATUS:      No data to display         awake, alert, oriented to person, place and time recent and remote memory intact normal attention and concentration language fluent, comprehension intact, naming intact fund of knowledge  appropriate  CRANIAL NERVE:  2nd, 3rd, 4th, 6th - Visual fields full to confrontation, extraocular muscles intact, no nystagmus 5th - facial sensation symmetric 7th - facial strength symmetric 8th - hearing intact 9th - palate elevates symmetrically, uvula midline 11th - shoulder shrug symmetric 12th - tongue protrusion midline  MOTOR:  normal bulk and tone, full strength in the BUE, BLE  SENSORY:  normal and symmetric to light touch  COORDINATION:  finger-nose-finger, fine finger movements normal  REFLEXES:  deep tendon reflexes present and symmetric  GAIT/STATION:  normal   DIAGNOSTIC DATA (LABS, IMAGING, TESTING) - I reviewed patient records, labs, notes, testing and imaging myself where available.  Lab Results  Component Value Date   WBC 4.9 03/05/2018   HGB 13.1 03/05/2018   HCT 38.3 03/05/2018   MCV 87.2 03/05/2018   PLT 195 03/05/2018      Component Value Date/Time   NA 135 10/25/2017 2015   K 4.1 10/25/2017 2015   CL 107 10/25/2017 2015   CO2 20 (L) 10/25/2017 2015   GLUCOSE 80 10/25/2017 2015   BUN 12 10/25/2017 2015   CREATININE 0.73 10/25/2017 2015   CALCIUM 8.3 (L) 10/25/2017 2015   PROT 6.3 (L) 10/25/2017 2015   ALBUMIN 2.9 (L) 10/25/2017  2015   AST 19 10/25/2017 2015   ALT 7 (L) 10/25/2017 2015   ALKPHOS 190 (H) 10/25/2017 2015   BILITOT 0.6 10/25/2017 2015   GFRNONAA >60 10/25/2017 2015   GFRAA >60 10/25/2017 2015   No results found for: "CHOL", "HDL", "LDLCALC", "LDLDIRECT", "TRIG", "CHOLHDL" No results found for: "HGBA1C" No results found for: "VITAMINB12" No results found for: "TSH"     ASSESSMENT AND PLAN  37 y.o. year old female with history of IBS who is presenting with multiple syncopal episodes for the past 9 years.  Based on prodrome of feeling hot, vision going dark, feeling lightheaded these are likely vasovagal syncope.  Will still obtain a routine EEG to rule out seizure activity.  I will contact the patient to go over the results.  During this time, I did advise her to increase her water intake, increase her electrolyte and follow-up with PCP and cardiology as scheduled.  Return as needed   1. Syncope, unspecified syncope type      Patient Instructions  Increase hydration Continue current medications Routine EEG Continue follow-up PCP and cardiology Return as needed  Orders Placed This Encounter  Procedures   EEG adult    No orders of the defined types were placed in this encounter.   Return if symptoms worsen or fail to improve.    Windell Norfolk, MD 05/13/2023, 3:20 PM  [facility] [address] [place], [place] [address] [phone]

## 2023-05-28 ENCOUNTER — Other Ambulatory Visit: Payer: Commercial Managed Care - PPO | Admitting: *Deleted

## 2023-06-09 DIAGNOSIS — Z0000 Encounter for general adult medical examination without abnormal findings: Secondary | ICD-10-CM | POA: Diagnosis not present

## 2023-06-09 DIAGNOSIS — Z13220 Encounter for screening for lipoid disorders: Secondary | ICD-10-CM | POA: Diagnosis not present

## 2023-06-09 DIAGNOSIS — F411 Generalized anxiety disorder: Secondary | ICD-10-CM | POA: Diagnosis not present

## 2023-06-18 DIAGNOSIS — K6389 Other specified diseases of intestine: Secondary | ICD-10-CM | POA: Diagnosis not present

## 2023-06-18 DIAGNOSIS — K529 Noninfective gastroenteritis and colitis, unspecified: Secondary | ICD-10-CM | POA: Diagnosis not present

## 2023-08-28 ENCOUNTER — Other Ambulatory Visit (HOSPITAL_BASED_OUTPATIENT_CLINIC_OR_DEPARTMENT_OTHER): Payer: Self-pay

## 2023-08-28 MED ORDER — INFLUENZA VIRUS VACC SPLIT PF (FLUZONE) 0.5 ML IM SUSY
0.5000 mL | PREFILLED_SYRINGE | Freq: Once | INTRAMUSCULAR | 0 refills | Status: AC
  Filled 2023-08-28: qty 0.5, 1d supply, fill #0

## 2023-09-10 DIAGNOSIS — B851 Pediculosis due to Pediculus humanus corporis: Secondary | ICD-10-CM | POA: Diagnosis not present

## 2023-09-10 DIAGNOSIS — B850 Pediculosis due to Pediculus humanus capitis: Secondary | ICD-10-CM | POA: Diagnosis not present

## 2023-09-15 DIAGNOSIS — Z113 Encounter for screening for infections with a predominantly sexual mode of transmission: Secondary | ICD-10-CM | POA: Diagnosis not present

## 2023-09-15 DIAGNOSIS — B853 Phthiriasis: Secondary | ICD-10-CM | POA: Diagnosis not present

## 2023-09-15 DIAGNOSIS — F411 Generalized anxiety disorder: Secondary | ICD-10-CM | POA: Diagnosis not present

## 2023-10-07 DIAGNOSIS — F411 Generalized anxiety disorder: Secondary | ICD-10-CM | POA: Diagnosis not present

## 2023-10-07 DIAGNOSIS — N3001 Acute cystitis with hematuria: Secondary | ICD-10-CM | POA: Diagnosis not present

## 2023-10-07 DIAGNOSIS — R350 Frequency of micturition: Secondary | ICD-10-CM | POA: Diagnosis not present

## 2023-10-25 ENCOUNTER — Encounter (HOSPITAL_BASED_OUTPATIENT_CLINIC_OR_DEPARTMENT_OTHER): Payer: Self-pay

## 2023-10-25 ENCOUNTER — Other Ambulatory Visit: Payer: Self-pay

## 2023-10-25 ENCOUNTER — Emergency Department (HOSPITAL_BASED_OUTPATIENT_CLINIC_OR_DEPARTMENT_OTHER)
Admission: EM | Admit: 2023-10-25 | Discharge: 2023-10-25 | Disposition: A | Discharge status: Home / Self Care | Payer: Self-pay | Attending: Emergency Medicine | Admitting: Emergency Medicine

## 2023-10-25 ENCOUNTER — Emergency Department (HOSPITAL_BASED_OUTPATIENT_CLINIC_OR_DEPARTMENT_OTHER): Payer: Commercial Managed Care - PPO

## 2023-10-25 DIAGNOSIS — R1013 Epigastric pain: Secondary | ICD-10-CM

## 2023-10-25 DIAGNOSIS — I10 Essential (primary) hypertension: Secondary | ICD-10-CM | POA: Diagnosis not present

## 2023-10-25 LAB — CBC WITH DIFFERENTIAL/PLATELET
Abs Immature Granulocytes: 0.01 10*3/uL (ref 0.00–0.07)
Basophils Absolute: 0.1 10*3/uL (ref 0.0–0.1)
Basophils Relative: 1 %
Eosinophils Absolute: 0.1 10*3/uL (ref 0.0–0.5)
Eosinophils Relative: 2 %
HCT: 37.8 % (ref 36.0–46.0)
Hemoglobin: 13.4 g/dL (ref 12.0–15.0)
Immature Granulocytes: 0 %
Lymphocytes Relative: 25 %
Lymphs Abs: 1.5 10*3/uL (ref 0.7–4.0)
MCH: 30.8 pg (ref 26.0–34.0)
MCHC: 35.4 g/dL (ref 30.0–36.0)
MCV: 86.9 fL (ref 80.0–100.0)
Monocytes Absolute: 0.5 10*3/uL (ref 0.1–1.0)
Monocytes Relative: 8 %
Neutro Abs: 3.9 10*3/uL (ref 1.7–7.7)
Neutrophils Relative %: 64 %
Platelets: 237 10*3/uL (ref 150–400)
RBC: 4.35 MIL/uL (ref 3.87–5.11)
RDW: 11.8 % (ref 11.5–15.5)
WBC: 6.1 10*3/uL (ref 4.0–10.5)
nRBC: 0 % (ref 0.0–0.2)

## 2023-10-25 LAB — URINALYSIS, W/ REFLEX TO CULTURE (INFECTION SUSPECTED)
Bilirubin Urine: NEGATIVE
Glucose, UA: NEGATIVE mg/dL
Ketones, ur: NEGATIVE mg/dL
Leukocytes,Ua: NEGATIVE
Nitrite: NEGATIVE
Protein, ur: NEGATIVE mg/dL
Specific Gravity, Urine: 1.025 (ref 1.005–1.030)
pH: 6 (ref 5.0–8.0)

## 2023-10-25 LAB — COMPREHENSIVE METABOLIC PANEL
ALT: 8 U/L (ref 0–44)
AST: 19 U/L (ref 15–41)
Albumin: 4.4 g/dL (ref 3.5–5.0)
Alkaline Phosphatase: 46 U/L (ref 38–126)
Anion gap: 8 (ref 5–15)
BUN: 14 mg/dL (ref 6–20)
CO2: 25 mmol/L (ref 22–32)
Calcium: 9.1 mg/dL (ref 8.9–10.3)
Chloride: 103 mmol/L (ref 98–111)
Creatinine, Ser: 0.87 mg/dL (ref 0.44–1.00)
GFR, Estimated: >60 mL/min (ref >60)
Glucose, Bld: 86 mg/dL (ref 70–99)
Potassium: 3.7 mmol/L (ref 3.5–5.1)
Sodium: 136 mmol/L (ref 135–145)
Total Bilirubin: 1.1 mg/dL (ref <1.2)
Total Protein: 7.2 g/dL (ref 6.5–8.1)

## 2023-10-25 LAB — TROPONIN I (HIGH SENSITIVITY)
Troponin I (High Sensitivity): 3 ng/L (ref <18)
Troponin I (High Sensitivity): 3 ng/L (ref <18)

## 2023-10-25 LAB — PREGNANCY, URINE: Preg Test, Ur: NEGATIVE

## 2023-10-25 LAB — LIPASE, BLOOD: Lipase: 34 U/L (ref 11–51)

## 2023-10-25 NOTE — ED Provider Notes (Signed)
[facility] Provider Note   CSN: [id number] Arrival date & time: 10/25/23  1540     History {Add pertinent medical, surgical, social history, OB history to HPI:1} Chief Complaint  Patient presents with   Abdominal Pain    Brooke Hale is a 37 y.o. female.  HPI    37 year old female with a history of IBS, migraine, pregnancy-induced hypertension, depression  Over the last month has been 130s-150s/90s, last night felt badly, didn't feel good. When got home went straight to the bathroom thought was going to vomit, had epigastric pain. Got into bathtub, got out then had BM, diarrhea, then felt ok after that.  Had issues recently with weight loss, crying every day, difficulty hearing  November 12th started on wellbutrin.  Lost 10lb, not eating much not sure if stress or medicine or both.  Last night did make self eat and started to feel worse.  Today felt better.  20 minutes ago began to have epigastric pain, pulsating pain.  No radiation.  Nausea, constipated prior to last night, not sure if nto eating a lot or constipation. No vomiting. No diarrhea today. Probably 500cal per day. No black or bloody stools.   No fever, cp, dyspnea, cough, no leg pain or swelling  Denies numbness, weakness, difficulty talking or walking, visual changes or facial droop.    No nasal congestion with drying  No headache  Finished cipro about 1 week ago for UTI.  Past Medical History:  Diagnosis Date   Depression    post partum, keeping on medicine   Hemorrhoids    Migraine    PID (pelvic inflammatory disease) 08/2012   tx'd for PID   Pregnancy induced hypertension    with pregnancy, resolved, no meds currently   SVD (spontaneous vaginal delivery)    x 3     Home Medications Prior to Admission medications   Medication Sig Start Date End Date Taking? Authorizing Provider  dicyclomine (BENTYL) 10 MG capsule Take 10 mg by mouth 4 (four)  times daily -  before meals and at bedtime.    [provider]  LORazepam (ATIVAN) 0.5 MG tablet Take 0.5 mg by mouth every 8 (eight) hours.    [provider]      Allergies    Sulfa antibiotics, Amoxicillin, Amoxicillin-pot clavulanate, Augmentin [amoxicillin-pot clavulanate], Bactrim, Cefaclor, Cefixime, Cefixime, Erythromycin, Sulfamethoxazole-trimethoprim, and Penicillins    Review of Systems   Review of Systems  Physical Exam Updated Vital Signs BP (!) 189/108 (BP Location: Right Arm)   Pulse 64   Temp 97.7 F (36.5 C) (Oral)   Resp 18   Ht 5\' 3"  (1.6 m)   Wt 50.3 kg   LMP 10/24/2023 (Exact Date)   SpO2 100%   BMI 19.66 kg/m  Physical Exam  ED Results / Procedures / Treatments   Labs (all labs ordered are listed, but only abnormal results are displayed) Labs Reviewed - No data to display  EKG None  Radiology No results found.  Procedures Procedures  {Document cardiac monitor, telemetry assessment procedure when appropriate:1}  Medications Ordered in ED Medications - No data to display  ED Course/ Medical Decision Making/ A&P   {   Click here for ABCD2, HEART and other calculatorsREFRESH Note before signing :1}                              Medical Decision Making Amount  and/or Complexity of Data Reviewed Labs: ordered. Radiology: ordered.   ***  Labs completed and personally about interpreted by me show no leukocytosis, no anemia, no transaminitis, no clinically significant electrolyte abnormalities, negative troponin have low suspicion for ACS, negative pregnancy test, no signs of UTI.    {Document critical care time when appropriate:1} {Document review of labs and clinical decision tools ie heart score, Chads2Vasc2 etc:1}  {Document your independent review of radiology images, and any outside records:1} {Document your discussion with family members, caretakers, and with consultants:1} {Document social determinants of health  affecting pt's care:1} {Document your decision making why or why not admission, treatments were needed:1} Final Clinical Impression(s) / ED Diagnoses Final diagnoses:  None    Rx / DC Orders ED Discharge Orders     None

## 2023-10-25 NOTE — ED Triage Notes (Signed)
Started wellbutrin 6 weeks ago, has been having hypertension since then. Last night was 172/111. Was working and continuing to have hypertension. Having epigastric pain and "feels a pulsation" in abdomen.

## 2023-11-02 DIAGNOSIS — R030 Elevated blood-pressure reading, without diagnosis of hypertension: Secondary | ICD-10-CM | POA: Diagnosis not present

## 2023-11-02 DIAGNOSIS — F411 Generalized anxiety disorder: Secondary | ICD-10-CM | POA: Diagnosis not present

## 2023-11-03 ENCOUNTER — Other Ambulatory Visit (HOSPITAL_BASED_OUTPATIENT_CLINIC_OR_DEPARTMENT_OTHER): Payer: Self-pay

## 2023-11-06 ENCOUNTER — Other Ambulatory Visit (HOSPITAL_BASED_OUTPATIENT_CLINIC_OR_DEPARTMENT_OTHER): Payer: Self-pay

## 2023-12-16 DIAGNOSIS — F411 Generalized anxiety disorder: Secondary | ICD-10-CM | POA: Diagnosis not present

## 2023-12-17 ENCOUNTER — Other Ambulatory Visit (HOSPITAL_BASED_OUTPATIENT_CLINIC_OR_DEPARTMENT_OTHER): Payer: Self-pay

## 2023-12-18 ENCOUNTER — Other Ambulatory Visit: Payer: Self-pay

## 2023-12-18 ENCOUNTER — Other Ambulatory Visit (HOSPITAL_BASED_OUTPATIENT_CLINIC_OR_DEPARTMENT_OTHER): Payer: Self-pay

## 2023-12-18 MED ORDER — ESCITALOPRAM OXALATE 10 MG PO TABS
10.0000 mg | ORAL_TABLET | Freq: Every day | ORAL | 1 refills | Status: AC
  Filled 2023-12-18 (×2): qty 90, 90d supply, fill #0
  Filled 2024-03-25: qty 90, 90d supply, fill #1

## 2024-03-25 ENCOUNTER — Other Ambulatory Visit (HOSPITAL_BASED_OUTPATIENT_CLINIC_OR_DEPARTMENT_OTHER): Payer: Self-pay

## 2024-06-29 ENCOUNTER — Other Ambulatory Visit (HOSPITAL_BASED_OUTPATIENT_CLINIC_OR_DEPARTMENT_OTHER): Payer: Self-pay

## 2024-06-29 DIAGNOSIS — F411 Generalized anxiety disorder: Secondary | ICD-10-CM | POA: Diagnosis not present

## 2024-06-29 DIAGNOSIS — Z0000 Encounter for general adult medical examination without abnormal findings: Secondary | ICD-10-CM | POA: Diagnosis not present

## 2024-06-29 DIAGNOSIS — Z13220 Encounter for screening for lipoid disorders: Secondary | ICD-10-CM | POA: Diagnosis not present

## 2024-06-29 DIAGNOSIS — Z1329 Encounter for screening for other suspected endocrine disorder: Secondary | ICD-10-CM | POA: Diagnosis not present

## 2024-06-29 MED ORDER — ESCITALOPRAM OXALATE 10 MG PO TABS
5.0000 mg | ORAL_TABLET | Freq: Every day | ORAL | 0 refills | Status: AC
  Filled 2024-06-29: qty 30, 60d supply, fill #0

## 2024-07-15 DIAGNOSIS — Z03818 Encounter for observation for suspected exposure to other biological agents ruled out: Secondary | ICD-10-CM | POA: Diagnosis not present

## 2024-07-15 DIAGNOSIS — J029 Acute pharyngitis, unspecified: Secondary | ICD-10-CM | POA: Diagnosis not present

## 2024-07-23 DIAGNOSIS — R051 Acute cough: Secondary | ICD-10-CM | POA: Diagnosis not present

## 2024-08-15 ENCOUNTER — Other Ambulatory Visit (HOSPITAL_BASED_OUTPATIENT_CLINIC_OR_DEPARTMENT_OTHER): Payer: Self-pay

## 2024-08-15 MED ORDER — FLUZONE 0.5 ML IM SUSY
0.5000 mL | PREFILLED_SYRINGE | Freq: Once | INTRAMUSCULAR | 0 refills | Status: AC
  Filled 2024-08-15: qty 0.5, 1d supply, fill #0

## 2024-09-22 DIAGNOSIS — F331 Major depressive disorder, recurrent, moderate: Secondary | ICD-10-CM | POA: Diagnosis not present

## 2024-09-27 DIAGNOSIS — F331 Major depressive disorder, recurrent, moderate: Secondary | ICD-10-CM | POA: Diagnosis not present

## 2024-10-04 DIAGNOSIS — F331 Major depressive disorder, recurrent, moderate: Secondary | ICD-10-CM | POA: Diagnosis not present

## 2024-10-18 DIAGNOSIS — F331 Major depressive disorder, recurrent, moderate: Secondary | ICD-10-CM | POA: Diagnosis not present
